# Patient Record
Sex: Male | Born: 1935 | Race: White | Hispanic: No | Marital: Married | Smoking: Never smoker
Health system: Southern US, Community
[De-identification: ages and names within clinical notes are randomized; demographics above are authoritative.]

## PROBLEM LIST (undated history)

## (undated) DIAGNOSIS — I10 Essential (primary) hypertension: Secondary | ICD-10-CM

## (undated) DIAGNOSIS — I2510 Atherosclerotic heart disease of native coronary artery without angina pectoris: Secondary | ICD-10-CM

## (undated) HISTORY — PX: CORONARY ANGIOPLASTY WITH STENT PLACEMENT: SHX49

## (undated) HISTORY — PX: HERNIA REPAIR: SHX51

---

## 2004-10-26 ENCOUNTER — Ambulatory Visit: Payer: Self-pay | Admitting: Internal Medicine

## 2004-10-28 ENCOUNTER — Ambulatory Visit: Payer: Self-pay | Admitting: Internal Medicine

## 2006-09-28 ENCOUNTER — Inpatient Hospital Stay: Payer: Self-pay | Admitting: General Surgery

## 2007-10-12 ENCOUNTER — Ambulatory Visit: Payer: Self-pay | Admitting: Internal Medicine

## 2009-12-15 ENCOUNTER — Ambulatory Visit: Payer: Self-pay | Admitting: Surgery

## 2009-12-25 ENCOUNTER — Ambulatory Visit: Payer: Self-pay | Admitting: Surgery

## 2011-01-23 ENCOUNTER — Emergency Department: Payer: Self-pay | Admitting: Emergency Medicine

## 2011-05-10 DIAGNOSIS — Z8711 Personal history of peptic ulcer disease: Secondary | ICD-10-CM

## 2011-05-10 DIAGNOSIS — I10 Essential (primary) hypertension: Secondary | ICD-10-CM | POA: Diagnosis present

## 2011-05-10 DIAGNOSIS — I2510 Atherosclerotic heart disease of native coronary artery without angina pectoris: Secondary | ICD-10-CM | POA: Diagnosis present

## 2012-05-18 ENCOUNTER — Ambulatory Visit: Payer: Self-pay | Admitting: Internal Medicine

## 2013-06-20 ENCOUNTER — Inpatient Hospital Stay: Payer: Self-pay | Admitting: Internal Medicine

## 2013-06-20 LAB — CBC
HCT: 21.3 % — ABNORMAL LOW (ref 40.0–52.0)
HGB: 7.4 g/dL — ABNORMAL LOW (ref 13.0–18.0)
MCH: 31.8 pg (ref 26.0–34.0)
MCHC: 34.5 g/dL (ref 32.0–36.0)
MCV: 92 fL (ref 80–100)
Platelet: 246 10*3/uL (ref 150–440)
RBC: 2.32 10*6/uL — ABNORMAL LOW (ref 4.40–5.90)
RDW: 13.6 % (ref 11.5–14.5)
WBC: 16.1 10*3/uL — ABNORMAL HIGH (ref 3.8–10.6)

## 2013-06-20 LAB — BASIC METABOLIC PANEL
Anion Gap: 13 (ref 7–16)
BUN: 93 mg/dL — ABNORMAL HIGH (ref 7–18)
Calcium, Total: 7.9 mg/dL — ABNORMAL LOW (ref 8.5–10.1)
Chloride: 107 mmol/L (ref 98–107)
Co2: 17 mmol/L — ABNORMAL LOW (ref 21–32)
Creatinine: 2.98 mg/dL — ABNORMAL HIGH (ref 0.60–1.30)
EGFR (African American): 22 — ABNORMAL LOW
EGFR (Non-African Amer.): 19 — ABNORMAL LOW
Glucose: 346 mg/dL — ABNORMAL HIGH (ref 65–99)
Osmolality: 316 (ref 275–301)
Potassium: 3.8 mmol/L (ref 3.5–5.1)
Sodium: 137 mmol/L (ref 136–145)

## 2013-06-20 LAB — PROTIME-INR
INR: 1.1
Prothrombin Time: 14.4 secs (ref 11.5–14.7)

## 2013-06-20 LAB — TROPONIN I
Troponin-I: <0.02 ng/mL
Troponin-I: 0.02 ng/mL

## 2013-06-20 LAB — APTT: Activated PTT: <23 secs — ABNORMAL LOW (ref 23.6–35.9)

## 2013-06-21 LAB — CBC WITH DIFFERENTIAL/PLATELET
Basophil #: 0.1 10*3/uL (ref 0.0–0.1)
Basophil %: 0.3 %
Eosinophil #: 0 10*3/uL (ref 0.0–0.7)
Eosinophil %: 0.1 %
HCT: 24.5 % — ABNORMAL LOW (ref 40.0–52.0)
HGB: 8.6 g/dL — ABNORMAL LOW (ref 13.0–18.0)
Lymphocyte #: 0.9 10*3/uL — ABNORMAL LOW (ref 1.0–3.6)
Lymphocyte %: 5.7 %
MCH: 31 pg (ref 26.0–34.0)
MCHC: 35.3 g/dL (ref 32.0–36.0)
MCV: 88 fL (ref 80–100)
Monocyte #: 0.9 x10 3/mm (ref 0.2–1.0)
Monocyte %: 6.2 %
Neutrophil #: 13.4 10*3/uL — ABNORMAL HIGH (ref 1.4–6.5)
Neutrophil %: 87.7 %
Platelet: 182 10*3/uL (ref 150–440)
RBC: 2.79 10*6/uL — ABNORMAL LOW (ref 4.40–5.90)
RDW: 15.2 % — ABNORMAL HIGH (ref 11.5–14.5)
WBC: 15.2 10*3/uL — ABNORMAL HIGH (ref 3.8–10.6)

## 2013-06-21 LAB — CK TOTAL AND CKMB (NOT AT ARMC)
CK, Total: 103 U/L (ref 35–232)
CK, Total: 81 U/L (ref 35–232)
CK, Total: 80 U/L (ref 35–232)
CK-MB: 2 ng/mL (ref 0.5–3.6)
CK-MB: 1.8 ng/mL (ref 0.5–3.6)
CK-MB: 1.8 ng/mL (ref 0.5–3.6)

## 2013-06-21 LAB — TROPONIN I: Troponin-I: 0.05 ng/mL

## 2013-06-22 LAB — BASIC METABOLIC PANEL
Anion Gap: 5 — ABNORMAL LOW (ref 7–16)
BUN: 46 mg/dL — ABNORMAL HIGH (ref 7–18)
Calcium, Total: 7.7 mg/dL — ABNORMAL LOW (ref 8.5–10.1)
Chloride: 117 mmol/L — ABNORMAL HIGH (ref 98–107)
Co2: 22 mmol/L (ref 21–32)
Creatinine: 2.34 mg/dL — ABNORMAL HIGH (ref 0.60–1.30)
EGFR (African American): 30 — ABNORMAL LOW
EGFR (Non-African Amer.): 26 — ABNORMAL LOW
Glucose: 121 mg/dL — ABNORMAL HIGH (ref 65–99)
Osmolality: 300 (ref 275–301)
Potassium: 3.8 mmol/L (ref 3.5–5.1)
Sodium: 144 mmol/L (ref 136–145)

## 2013-06-22 LAB — CBC WITH DIFFERENTIAL/PLATELET
Basophil #: 0.1 10*3/uL (ref 0.0–0.1)
Basophil %: 1 %
Eosinophil #: 0.3 10*3/uL (ref 0.0–0.7)
Eosinophil %: 3.1 %
HCT: 26 % — ABNORMAL LOW (ref 40.0–52.0)
HGB: 9.3 g/dL — ABNORMAL LOW (ref 13.0–18.0)
Lymphocyte #: 0.9 10*3/uL — ABNORMAL LOW (ref 1.0–3.6)
Lymphocyte %: 8.7 %
MCH: 31.4 pg (ref 26.0–34.0)
MCHC: 35.5 g/dL (ref 32.0–36.0)
MCV: 88 fL (ref 80–100)
Monocyte #: 0.8 x10 3/mm (ref 0.2–1.0)
Monocyte %: 7.6 %
Neutrophil #: 8.4 10*3/uL — ABNORMAL HIGH (ref 1.4–6.5)
Neutrophil %: 79.6 %
Platelet: 146 10*3/uL — ABNORMAL LOW (ref 150–440)
RBC: 2.95 10*6/uL — ABNORMAL LOW (ref 4.40–5.90)
RDW: 15.6 % — ABNORMAL HIGH (ref 11.5–14.5)
WBC: 10.6 10*3/uL (ref 3.8–10.6)

## 2013-06-23 LAB — CBC WITH DIFFERENTIAL/PLATELET
Basophil #: 0.1 10*3/uL (ref 0.0–0.1)
Basophil %: 0.8 %
Eosinophil #: 0.4 10*3/uL (ref 0.0–0.7)
Eosinophil %: 3.9 %
HCT: 26.4 % — ABNORMAL LOW (ref 40.0–52.0)
HGB: 9.5 g/dL — ABNORMAL LOW (ref 13.0–18.0)
Lymphocyte #: 0.9 10*3/uL — ABNORMAL LOW (ref 1.0–3.6)
Lymphocyte %: 10.2 %
MCH: 31.6 pg (ref 26.0–34.0)
MCHC: 36.2 g/dL — ABNORMAL HIGH (ref 32.0–36.0)
MCV: 87 fL (ref 80–100)
Monocyte #: 0.7 x10 3/mm (ref 0.2–1.0)
Monocyte %: 7.9 %
Neutrophil #: 7 10*3/uL — ABNORMAL HIGH (ref 1.4–6.5)
Neutrophil %: 77.2 %
Platelet: 160 10*3/uL (ref 150–440)
RBC: 3.02 10*6/uL — ABNORMAL LOW (ref 4.40–5.90)
RDW: 15.1 % — ABNORMAL HIGH (ref 11.5–14.5)
WBC: 9.1 10*3/uL (ref 3.8–10.6)

## 2013-06-23 LAB — BASIC METABOLIC PANEL
Anion Gap: 6 — ABNORMAL LOW (ref 7–16)
BUN: 32 mg/dL — ABNORMAL HIGH (ref 7–18)
Calcium, Total: 8.1 mg/dL — ABNORMAL LOW (ref 8.5–10.1)
Chloride: 115 mmol/L — ABNORMAL HIGH (ref 98–107)
Co2: 22 mmol/L (ref 21–32)
Creatinine: 2.13 mg/dL — ABNORMAL HIGH (ref 0.60–1.30)
EGFR (African American): 33 — ABNORMAL LOW
EGFR (Non-African Amer.): 29 — ABNORMAL LOW
Glucose: 118 mg/dL — ABNORMAL HIGH (ref 65–99)
Osmolality: 293 (ref 275–301)
Potassium: 3.7 mmol/L (ref 3.5–5.1)
Sodium: 143 mmol/L (ref 136–145)

## 2013-08-05 ENCOUNTER — Ambulatory Visit: Payer: Self-pay | Admitting: Gastroenterology

## 2013-08-07 LAB — PATHOLOGY REPORT

## 2013-11-19 ENCOUNTER — Ambulatory Visit: Payer: Self-pay | Admitting: Orthopedic Surgery

## 2014-11-14 NOTE — Consult Note (Signed)
Pt seen and examined. Full consult to follow. Admitted with CP last night. Still with some CP. Troponin levels are neg so far. Known CAD hx with 5 cardiac stents. Recent increase in dyspepsia, heartburn, etc. On prilosec daily at home. On ASA/plavix. Last plavix Tues. Abd nontender. Needs EGD but would like to make sure current chest pain is NOT cardiac in origin AND off plavix for more days. If patient requires further hospital stay, then can arrange EGD on Monday. If patient stable to go home shortly, can arrange EGD as outpt. Continue PPI bid. Dr. Vira Agar to check on patient tomorrow. Thanks.  Electronic Signatures: Verdie Shire (MD)  (Signed on 28-Nov-14 10:59)  Authored  Last Updated: 28-Nov-14 10:59 by Verdie Shire (MD)

## 2014-11-14 NOTE — H&P (Signed)
Eddie Mullen NAME:  Eddie Mullen, Eddie Mullen MR#:  [id number] DATE OF BIRTH:  [birth year]  DATE OF ADMISSION:  06/20/2013  PRIMARY CARE PHYSICIAN: Dr. Emily Filbert.   REFERRING PHYSICIAN: Marijo Conception, PA-C  CHIEF COMPLAINT: Chest pain.   HISTORY OF PRESENT ILLNESS: Eddie Mullen is a 81 year old, pleasant, white male with past medical history of hypertension, hyperlipidemia, coronary artery disease, status post stent placement about 10 years back, presented to the Emergency Department with complaints of chest pain on the left side of the chest. The pain started while sleeping, woke him up from sleep. The pain is around left shoulder area. The Eddie Mullen states that this sharp in nature, associated with some shortness of breath. The pain is radiating to the left arm. Concerning this, took nitroglycerin, three pills without much improvement. Concerning this, called EMS and was brought to the Emergency Department. The Eddie Mullen was seen by his primary physician yesterday concerning about generalized weakness with complaints of black of stools. Labs were drawn; however, we have not received the lab results. The Eddie Mullen had one episode of black stools yesterday and a second episode of today. The Eddie Mullen is noted to have a hemoglobin of 7.2. The Eddie Mullen states that the Eddie Mullen has been normal baseline hemoglobin. The Eddie Mullen is on Plavix. The Eddie Mullen is currently receiving 2 units of packed RBC, started on Protonix drip. As the Eddie Mullen was complaining of chest pain, the Eddie Mullen was started on nitroglycerin drip and received two doses of morphine with some improvement. Denies having any shortness of breath. The Eddie Mullen at baseline walks around the house. The Eddie Mullen is also noted to have elevated BUN and creatinine. We do not have the Eddie Mullen's baseline the creatinine. The Eddie Mullen has BUN 96, creatinine of 2.96. Denies having any abdominal pain.   PAST MEDICAL HISTORY: 1.  Hypertension.  2. Hyperlipidemia.  3.  Coronary  artery disease status post stent placement.   PAST SURGICAL HISTORY: 1.  Abdominal hernia repair.  2.  Appendectomy.  3.  Cardiac stent placement.   ALLERGIES: PENICILLIN.   HOME MEDICATIONS: 1.  Ambien 10 mg at bedtime.  2.  Plavix 75 mg once a day.  3.  Toprol-XL 1.5 mg once a day.  4.  Imdur 30 mg once a day.  5.  Atorvastatin 40 mg once a day.  6.  Amlodipine 10 mg once a day.   SOCIAL HISTORY: No history of smoking, drinking alcohol or using illicit drugs. Married and lives with his wife.   FAMILY HISTORY: History of  coronary artery disease.   REVIEW OF SYSTEMS: CONSTITUTIONAL: Denies any generalized weakness.  EYES: No change in vision.  ENT: No change in hearing.  RESPIRATORY:  No cough, shortness of breath.  CARDIOVASCULAR: Has chest pain, palpitations.  GASTROINTESTINAL: Has  no nausea, vomiting, diarrhea, abdominal pain. Has black stools.  GENITOURINARY: No dysuria or hematuria.  ENDOCRINE: No polyuria or polydipsia.  HEMATOLOGIC:  No easy bruising or bleeding.  SKIN: No rash or lesions.  MUSCULOSKELETAL: No joint pains and aches.  NEUROLOGIC:  No weakness or numbness in any part of the body.   PHYSICAL EXAMINATION: GENERAL: This is a well-built, well-nourished, age-appropriate male, lying down in the bed, not in distress.  VITAL SIGNS: Temperature 97.6, pulse 102, blood pressure 185/80, respiratory rate of 16, oxygen saturation 100% on 2 liters of oxygen.  HEENT: Head normocephalic, atraumatic. Eyes: No scleral icterus. Conjunctivae normal. Pupils equal and react to light. Mucous membranes moist. No pharyngeal erythema.   NECK: Supple. No  lymphadenopathy. No JVD. No carotid bruit.  CHEST: Has no focal tenderness.  LUNGS: Bilaterally clear to auscultation.  HEART: S1, S2 regular. No murmurs are heard.  ABDOMEN: Bowel sounds present. Soft, nontender, nondistended.  EXTREMITIES: No pedal edema. Pulses 2+.  NEUROLOGIC:  The Eddie Mullen is alert, oriented to place,  person and time. Cranial nerves II through XII intact. Motor 5/5 in upper and lower extremities.  SKIN: No rashes or lesions.  MUSCULOSKELETAL: Good range of motion in all the extremities.   LABORATORY, DIAGNOSTIC AND RADIOLOGIC DATA: CBC: WBC of 15.1, hemoglobin 7.4, platelet count of 246.   Complete metabolic panel: Glucose 123456, BUN 93, creatinine of 2.98, bicarbonate 17.   Recent ultrasound of the kidneys showed increased echotexture of the cortex  of the liver suggestive of medical renal disease.    Guaiac profile is within normal limits.   Chest x-ray, one view portable: No acute cardiopulmonary disease.   EKG, 12-lead, shows normal sinus rhythm with no ST-T wave abnormalities.    ASSESSMENT AND PLAN: Eddie Mullen is a 82 year old male with known history of coronary artery disease comes to the Emergency Department with complaints of chest pain and gastrointestinal bleed. He is also found to have acute renal failure.  1.  Chest pain.  This seems to be more of a musculoskeletal pain. However, considering the Eddie Mullen's history of coronary artery disease, we will further cycle cardiac enzymes x 3. We will to discontinue the nitroglycerin drip as this seems to be more from the musculoskeletal pain.  2.  Gastrointestinal bleed. The Eddie Mullen is on Plavix. We will hold the Plavix. Keep the Eddie Mullen on Protonix drip. The Eddie Mullen had last EGD about 10 years back. We will consult gastroenterology in the morning. Continue to monitor CBC. The Eddie Mullen is currently receiving 2 units of packed red blood cells.   3.  Acute renal failure. Uncertain about the Eddie Mullen's baseline. We do not have any labs to compare.  However, the Eddie Mullen has elevated BUN 96 and creatinine of 2.96. Continue with IV fluids and follow up. The Eddie Mullen had a recent echocardiogram done, which showed medical renal disease. We will consult nephrology.  4.  Anemia secondary to acute blood loss. The Eddie Mullen is currently receiving 2 units of  packed red blood cells.  5.  Hypertension, poorly controlled. This could be secondary to anxiety. We will continue to follow up. The Eddie Mullen initially had normal blood pressure.  6.  History of coronary artery disease. We will hold the Plavix, continue with the metoprolol.  7.  Keep the Eddie Mullen on deep vein thrombosis prophylaxis with sequential compression devices.   TIME SPENT: 45 minutes.   ____________________________ Monica Becton, MD pv:cc D: 06/21/2013 00:18:00 ET T: 06/21/2013 00:41:13 ET JOB#: [id number]  cc: Monica Becton, MD, <Dictator> Rusty Aus, MD Grier Mitts Che Rachal MD ELECTRONICALLY SIGNED 06/23/2013 0:47

## 2014-11-14 NOTE — Discharge Summary (Signed)
PATIENT NAME:  Eddie Mullen, Eddie Mullen MR#:  [id number] DATE OF BIRTH:  [birth year]  DATE OF ADMISSION:  06/20/2013 DATE OF DISCHARGE:  06/23/2013  DISCHARGE DIAGNOSES: 1.  Upper gastrointestinal bleed with blood loss anemia.  2.  Coronary artery disease with ongoing angina, relieved with transfusion.   DISCHARGE MEDICATIONS:  Please see [facility] medical reconciliation form for details. Changes were he will hold the Plavix for 2 days and he will increase his omeprazole at home from 20 mg daily to 40 mg, 2 of the 20 mg tablets he has.    HISTORY AND PHYSICAL: Please see detailed history and physical done on admission for details.   HOSPITAL COURSE: The patient was admitted with ongoing chest pain, melanotic stools and a hemoglobin of 7.4. His creatinine was also increased to 2.98, BUN to 93. His melena improved on IV pantoprazole. His angina also improved after a transfusion up to a hemoglobin of 9.5. Creatinine decreased to 2.13, BUN to 32. The exaggerated increase in BUN was likely due to upper GI bleed. He was ambulating, tolerating diet well by today. He had been seen by 2 different gastroenterologists, both of which did not wish to do EGD at this time. He probably does need an upper gastrointestinal luminal evaluation or at least an upper GI x-ray series to ensure there are no ominous findings to explain his bleeding. For now, he is happy, excited about the discharge, ambulating and ready to go home. I did evaluate his old records from his [place] cardiologist, who actually discontinued his Plavix in March but for some reason he had continued on that. Therefore, I think 2 days off of it should be okay.  His stents are not likely drug-eluding given that. He is also more than 2-1/2 years past history his stents.    TIME SPENT: Please note, I spent approximately 35 minutes on discharge tasks today.   ____________________________ Ocie Cornfield. Ouida Sills, MD mwa:cs D: 06/23/2013 09:14:26 ET T: 06/23/2013 19:24:52  ET JOB#: [id number]  cc: Ocie Cornfield. Ouida Sills, MD, <Dictator> Kirk Ruths MD ELECTRONICALLY SIGNED 06/24/2013 8:01

## 2014-11-14 NOTE — Consult Note (Signed)
Pt with hx of CAD and several stents, last one placed 2 years ago.  Had melena a few days ago and was admitted.  Hgb stable at 9.3.  Was taking ibuprofen.  Plt ct 146.  No complaints today.  Will advance diet to full liquids for supper today.  VSS afeb.  Can go home on 81mg  ASA tomorrow if all goes well til then.  Very likely NSAID induced bleeding, should be on full liquid diet for 3 more days then soft diet.  Follow up in office next week with Dr. Candace Cruise as I will be out of town. Go home on generic Protonix which will not interfere  with plavix as much as omeprazole.  Electronic Signatures: Manya Silvas (MD)  (Signed on 29-Nov-14 11:16)  Authored  Last Updated: 29-Nov-14 11:16 by Manya Silvas (MD)

## 2014-11-14 NOTE — Consult Note (Signed)
PATIENT NAME:  Eddie Mullen, Eddie Mullen MR#:  [id number] DATE OF BIRTH:  10-19-1932  DATE OF CONSULTATION:  06/21/2013  REFERRING PHYSICIAN:   CONSULTING PHYSICIAN:  Lupita Dawn. Ameera Tigue, MD  REASON FOR REFERRAL: Melena, chest pain.   DISCUSSION: The patient is a 81 year old white male with a known history of coronary artery disease who has had at least 5 cardiac stents placed over the years. He came to the Emergency Room last night complaining of chest pain on the left side that radiated to his left arm. It actually woke him up from sleep. He has some shortness of breath. He took some nitroglycerins on his own without much relief. He was then brought to the Emergency Room for further evaluation.   He had been complaining of generalized weakness with some dyspepsia as well as some heartburn. He also noticed some dark stools as well recently. He has been on Prilosec daily on and off for the past year. According to the wife, he has not been that compliant with his Prilosec.   The patient has been on aspirin and Plavix.   When he presented, his hemoglobin was 7.2. The Plavix was stopped. The patient required 2 units of blood overnight. He was also placed on a Protonix drip.   He is feeling much better overall, but the chest pain is still present. It is on and off. He denies any shortness of breath.   REVIEW OF SYSTEMS: There are no fevers or chills. There are no palpitations. There is chest pain. He had some mild shortness of breath yesterday but not today. There is no coughing. There are no visual or hearing changes. GI symptoms have been described already. There is no gross hematochezia or melena. The rest of the review of systems are negative.   PAST MEDICAL HISTORY: Notable for hypertension, hyperlipidemia, and coronary artery disease.   PAST SURGICAL HISTORY: Includes hernia repair, appendectomy as well as stent placement.   ALLERGIES: PENICILLIN.   HOME MEDICATIONS: Include Ambien at bedtime, amlodipine  10 mg daily, Imdur 30 mg daily, metoprolol XL, Plavix 75 mg daily, and atorvastatin 40 mg daily.   SOCIAL HISTORY: The patient denies smoking and drinking.   FAMILY HISTORY: Notable for CAD.  PHYSICAL EXAMINATION: GENERAL: The patient is in no acute distress.  VITAL SIGNS: He is afebrile. Vital signs are stable except initial blood pressure was high.  HEAD AND NECK: Within normal limits.  CARDIAC: Regular rhythm and rate.  LUNGS: Clear bilaterally.  ABDOMEN: Normoactive bowel sounds, soft, nontender right now. There is no hepatomegaly. He has active bowel sounds.  EXTREMITIES: No clubbing, cyanosis, or edema.  NEUROLOGIC: Nonfocal.  SKIN: Negative. RECTAL: In the Emergency Room, heme stool was heme negative though.   LABORATORY DATA: White count 15.1, hemoglobin 7.4, platelet count 214. Today with blood transfusion, hemoglobin is up to 8.6 and white count is still elevated at 15.2. CPK enzymes are normal. Creatinine is 2.98. INR is 1.1.   ASSESSMENT AND PLAN: This a patient with known history of coronary artery disease who has some chest pain. Even though the troponin levels are negative, we still need to make sure there is no pain coming from his heart. The patient probably has peptic ulcer disease. He is placed on a Protonix drip. I would like to schedule the endoscopy once he has been off the Plavix for a few days. The last dose of Plavix was on Tuesday. We need to make sure cardiology rules him out for a heart condition.  Will tentatively plan on doing an endoscopy on Monday unless condition changes. Thank you for the referral.  ____________________________ Lupita Dawn. Candace Cruise, MD pyo:sb D: 06/21/2013 15:16:00 ET T: 06/21/2013 15:32:00 ET JOB#: [id number]  cc: Lupita Dawn. Candace Cruise, MD, <Dictator> Lupita Dawn Libia Fazzini MD ELECTRONICALLY SIGNED 06/22/2013 11:36

## 2014-11-15 NOTE — Op Note (Signed)
PATIENT NAME:  Eddie Mullen, Eddie Mullen MR#:  [id number] DATE OF BIRTH:  1932-08-22  DATE OF PROCEDURE:  11/19/2013  PREOPERATIVE DIAGNOSIS:  Right anterior shoulder dislocation.   POSTOPERATIVE DIAGNOSIS:  Right anterior shoulder dislocation.  PROCEDURE PERFORMED:  Right shoulder closed reduction.   ANESTHESIA:  General.   SURGEON:  Hessie Knows, M.D.   DESCRIPTION OF PROCEDURE:  The patient was brought to the operating room and after adequate anesthesia was obtained, appropriate patient identification and timeout procedures were completed.  The C-arm was brought in and traction applied to the arm. First, a Saha maneuver was attempted. Under fluoroscopy, the entire scapula moved with the humeral head, and it appeared that the humeral head was locked on the glenoid. Gentle rotation was applied, and again, the scapula appeared to be attached to the glenoid. With some gentle extension of the shoulder with traction, this appeared to disengage what was presumed to be a Hill-Sachs lesion, and the shoulder could be gently rotated at this point; was brought into the Fontanelle position at this point.  With flexion, slight abduction and traction and with some gentle internal rotation with  anterior pressure on the humeral head, which could be palpated, the shoulder could be felt to reduce and visibly reduced on the C-arm. An AP C-arm view was saved showing the reduced shoulder. The patient was placed in a sling and sent to the recovery room in stable condition. There were no complications. No blood loss. No specimen.     ____________________________ Laurene Footman, MD mjm:dmm D: 11/19/2013 21:17:18 ET T: 11/19/2013 21:46:03 ET JOB#: [id number]  cc: Laurene Footman, MD, <Dictator> Laurene Footman MD ELECTRONICALLY SIGNED 11/20/2013 6:32

## 2014-12-11 DIAGNOSIS — N184 Chronic kidney disease, stage 4 (severe): Secondary | ICD-10-CM | POA: Diagnosis present

## 2015-12-25 DIAGNOSIS — R809 Proteinuria, unspecified: Secondary | ICD-10-CM | POA: Insufficient documentation

## 2015-12-25 DIAGNOSIS — E782 Mixed hyperlipidemia: Secondary | ICD-10-CM | POA: Insufficient documentation

## 2016-07-07 ENCOUNTER — Other Ambulatory Visit: Payer: Self-pay | Admitting: Internal Medicine

## 2016-07-07 DIAGNOSIS — R55 Syncope and collapse: Secondary | ICD-10-CM

## 2016-07-07 DIAGNOSIS — Z0000 Encounter for general adult medical examination without abnormal findings: Secondary | ICD-10-CM | POA: Insufficient documentation

## 2016-07-12 ENCOUNTER — Ambulatory Visit
Admission: RE | Admit: 2016-07-12 | Discharge: 2016-07-12 | Disposition: A | Discharge status: Home / Self Care | Payer: Medicare Other | Source: Ambulatory Visit | Attending: Internal Medicine | Admitting: Internal Medicine

## 2016-07-12 DIAGNOSIS — R55 Syncope and collapse: Secondary | ICD-10-CM

## 2016-07-12 DIAGNOSIS — I6523 Occlusion and stenosis of bilateral carotid arteries: Secondary | ICD-10-CM | POA: Insufficient documentation

## 2018-03-23 ENCOUNTER — Emergency Department: Payer: Medicare Other

## 2018-03-23 ENCOUNTER — Encounter: Payer: Self-pay | Admitting: Emergency Medicine

## 2018-03-23 ENCOUNTER — Other Ambulatory Visit: Payer: Self-pay

## 2018-03-23 ENCOUNTER — Observation Stay
Admission: EM | Admit: 2018-03-23 | Discharge: 2018-03-24 | Disposition: A | Discharge status: Home / Self Care | Payer: Medicare Other | Attending: Internal Medicine | Admitting: Internal Medicine

## 2018-03-23 DIAGNOSIS — Z79899 Other long term (current) drug therapy: Secondary | ICD-10-CM | POA: Diagnosis not present

## 2018-03-23 DIAGNOSIS — I700 Atherosclerosis of aorta: Secondary | ICD-10-CM | POA: Diagnosis not present

## 2018-03-23 DIAGNOSIS — I129 Hypertensive chronic kidney disease with stage 1 through stage 4 chronic kidney disease, or unspecified chronic kidney disease: Secondary | ICD-10-CM | POA: Insufficient documentation

## 2018-03-23 DIAGNOSIS — Z955 Presence of coronary angioplasty implant and graft: Secondary | ICD-10-CM | POA: Insufficient documentation

## 2018-03-23 DIAGNOSIS — Z7982 Long term (current) use of aspirin: Secondary | ICD-10-CM | POA: Diagnosis not present

## 2018-03-23 DIAGNOSIS — R079 Chest pain, unspecified: Secondary | ICD-10-CM | POA: Diagnosis present

## 2018-03-23 DIAGNOSIS — R0789 Other chest pain: Secondary | ICD-10-CM | POA: Diagnosis not present

## 2018-03-23 DIAGNOSIS — E785 Hyperlipidemia, unspecified: Secondary | ICD-10-CM | POA: Diagnosis not present

## 2018-03-23 DIAGNOSIS — I2510 Atherosclerotic heart disease of native coronary artery without angina pectoris: Secondary | ICD-10-CM | POA: Diagnosis not present

## 2018-03-23 DIAGNOSIS — Z8249 Family history of ischemic heart disease and other diseases of the circulatory system: Secondary | ICD-10-CM | POA: Insufficient documentation

## 2018-03-23 DIAGNOSIS — Z880 Allergy status to penicillin: Secondary | ICD-10-CM | POA: Diagnosis not present

## 2018-03-23 DIAGNOSIS — Z66 Do not resuscitate: Secondary | ICD-10-CM | POA: Diagnosis not present

## 2018-03-23 DIAGNOSIS — N184 Chronic kidney disease, stage 4 (severe): Secondary | ICD-10-CM | POA: Diagnosis not present

## 2018-03-23 HISTORY — DX: Essential (primary) hypertension: I10

## 2018-03-23 HISTORY — DX: Atherosclerotic heart disease of native coronary artery without angina pectoris: I25.10

## 2018-03-23 LAB — BASIC METABOLIC PANEL
Anion gap: 9 (ref 5–15)
BUN: 49 mg/dL — ABNORMAL HIGH (ref 8–23)
CO2: 22 mmol/L (ref 22–32)
Calcium: 8.6 mg/dL — ABNORMAL LOW (ref 8.9–10.3)
Chloride: 106 mmol/L (ref 98–111)
Creatinine, Ser: 3.5 mg/dL — ABNORMAL HIGH (ref 0.61–1.24)
GFR calc Af Amer: 17 mL/min — ABNORMAL LOW (ref >60)
GFR calc non Af Amer: 15 mL/min — ABNORMAL LOW (ref >60)
Glucose, Bld: 215 mg/dL — ABNORMAL HIGH (ref 70–99)
Potassium: 4.1 mmol/L (ref 3.5–5.1)
Sodium: 137 mmol/L (ref 135–145)

## 2018-03-23 LAB — GLUCOSE, CAPILLARY: Glucose-Capillary: 111 mg/dL — ABNORMAL HIGH (ref 70–99)

## 2018-03-23 LAB — CBC
HCT: 31.8 % — ABNORMAL LOW (ref 40.0–52.0)
Hemoglobin: 11.1 g/dL — ABNORMAL LOW (ref 13.0–18.0)
MCH: 32.8 pg (ref 26.0–34.0)
MCHC: 34.9 g/dL (ref 32.0–36.0)
MCV: 93.9 fL (ref 80.0–100.0)
Platelets: 203 10*3/uL (ref 150–440)
RBC: 3.39 MIL/uL — ABNORMAL LOW (ref 4.40–5.90)
RDW: 13.6 % (ref 11.5–14.5)
WBC: 9.4 10*3/uL (ref 3.8–10.6)

## 2018-03-23 LAB — TROPONIN I
Troponin I: <0.03 ng/mL (ref <0.03)
Troponin I: 0.04 ng/mL (ref <0.03)
Troponin I: 0.09 ng/mL (ref <0.03)

## 2018-03-23 MED ORDER — ASPIRIN EC 81 MG PO TBEC
81.0000 mg | DELAYED_RELEASE_TABLET | Freq: Every day | ORAL | Status: DC
  Administered 2018-03-24: 81 mg via ORAL
  Filled 2018-03-23: qty 1

## 2018-03-23 MED ORDER — METOPROLOL SUCCINATE ER 50 MG PO TB24
75.0000 mg | ORAL_TABLET | Freq: Every day | ORAL | Status: DC
  Administered 2018-03-24: 75 mg via ORAL
  Filled 2018-03-23: qty 1

## 2018-03-23 MED ORDER — INSULIN ASPART 100 UNIT/ML ~~LOC~~ SOLN: 0.0000 [IU] | Freq: Three times a day (TID) | SUBCUTANEOUS | Status: DC

## 2018-03-23 MED ORDER — ISOSORBIDE MONONITRATE ER 30 MG PO TB24
30.0000 mg | ORAL_TABLET | Freq: Every day | ORAL | Status: DC
  Administered 2018-03-24: 30 mg via ORAL
  Filled 2018-03-23: qty 1

## 2018-03-23 MED ORDER — ASPIRIN 81 MG PO CHEW
324.0000 mg | CHEWABLE_TABLET | Freq: Once | ORAL | Status: AC
  Administered 2018-03-23: 324 mg via ORAL
  Filled 2018-03-23: qty 4

## 2018-03-23 MED ORDER — ONDANSETRON HCL 4 MG PO TABS: 4.0000 mg | ORAL_TABLET | Freq: Four times a day (QID) | ORAL | Status: DC | PRN

## 2018-03-23 MED ORDER — ZOLPIDEM TARTRATE 5 MG PO TABS
10.0000 mg | ORAL_TABLET | Freq: Every evening | ORAL | Status: DC | PRN
  Filled 2018-03-23: qty 2

## 2018-03-23 MED ORDER — ATORVASTATIN CALCIUM 20 MG PO TABS: 40.0000 mg | ORAL_TABLET | Freq: Every evening | ORAL | Status: DC

## 2018-03-23 MED ORDER — ACETAMINOPHEN 325 MG PO TABS: 650.0000 mg | ORAL_TABLET | Freq: Four times a day (QID) | ORAL | Status: DC | PRN

## 2018-03-23 MED ORDER — ACETAMINOPHEN 650 MG RE SUPP: 650.0000 mg | Freq: Four times a day (QID) | RECTAL | Status: DC | PRN

## 2018-03-23 MED ORDER — AMLODIPINE BESYLATE 5 MG PO TABS
5.0000 mg | ORAL_TABLET | Freq: Every day | ORAL | Status: DC
  Administered 2018-03-24: 5 mg via ORAL
  Filled 2018-03-23: qty 1

## 2018-03-23 MED ORDER — HEPARIN SODIUM (PORCINE) 5000 UNIT/ML IJ SOLN
5000.0000 [IU] | Freq: Three times a day (TID) | INTRAMUSCULAR | Status: DC
  Administered 2018-03-23 – 2018-03-24 (×2): 5000 [IU] via SUBCUTANEOUS
  Filled 2018-03-23 (×2): qty 1

## 2018-03-23 MED ORDER — INSULIN ASPART 100 UNIT/ML ~~LOC~~ SOLN: 0.0000 [IU] | Freq: Every day | SUBCUTANEOUS | Status: DC

## 2018-03-23 MED ORDER — AMLODIPINE BESYLATE 5 MG PO TABS
5.0000 mg | ORAL_TABLET | ORAL | Status: AC
  Administered 2018-03-23: 5 mg via ORAL
  Filled 2018-03-23: qty 1

## 2018-03-23 MED ORDER — ASPIRIN EC 81 MG PO TBEC: 81.0000 mg | DELAYED_RELEASE_TABLET | Freq: Every day | ORAL | 2 refills | Status: AC

## 2018-03-23 MED ORDER — ASPIRIN 81 MG PO CHEW
CHEWABLE_TABLET | ORAL | Status: AC
  Filled 2018-03-23: qty 4

## 2018-03-23 MED ORDER — ZOLPIDEM TARTRATE 5 MG PO TABS
5.0000 mg | ORAL_TABLET | Freq: Every evening | ORAL | Status: DC | PRN
  Administered 2018-03-23: 5 mg via ORAL
  Filled 2018-03-23: qty 1

## 2018-03-23 MED ORDER — ONDANSETRON HCL 4 MG/2ML IJ SOLN: 4.0000 mg | Freq: Four times a day (QID) | INTRAMUSCULAR | Status: DC | PRN

## 2018-03-23 NOTE — ED Triage Notes (Signed)
Pt reports he woke up from nap not feeling right and BP was 200s/100s.  Had a pain in his left arm and some diaphoresis. Pain and diaphoresis resolved.  Did have some SHOB at first but that has also resolved.

## 2018-03-23 NOTE — ED Provider Notes (Signed)
[facility] Emergency Department Provider Note  Time seen: 5:52 PM  I have reviewed the triage vital signs and the nursing notes.   HISTORY  Chief Complaint Chest Pain    HPI CADIN LUKA is a 84 y.o. male with a past medical history of CAD, hypertension, prior stents, 3 placed in 2012 2 placed in 2001, currently follows up at [facility] cardiology presents to the emergency department with chest pain.  According to the patient around 4:30 PM he woke from a nap with significant chest pressure/tightness across both sides of his chest.  States he was feeling somewhat short of breath and nauseous his palms were sweaty.  He checked his blood pressure it stated 200s over 100s, patient became very concerned so he came to the emergency department.  Patient states the chest pain has since completely resolved denies any chest pain or trouble breathing at this time.  States he feels normal.  Patient states he rarely ever gets chest pain and has never had chest pain this bad.  Describes as moderate to severe tightness/squeezing but now resolved completely.   Past Medical History:  Diagnosis Date  . CAD (coronary artery disease)   . Hypertension     There are no active problems to display for this patient.   Past Surgical History:  Procedure Laterality Date  . CORONARY ANGIOPLASTY WITH STENT PLACEMENT    . HERNIA REPAIR      Prior to Admission medications   Not on File    Allergies  Allergen Reactions  . Penicillins Rash    History reviewed. No pertinent family history.  Social History Social History   Tobacco Use  . Smoking status: Never Smoker  . Smokeless tobacco: Never Used  Substance Use Topics  . Alcohol use: Yes  . Drug use: Never    Review of Systems Constitutional: Negative for fever Cardiovascular: Chest pain now resolved Respiratory: Shortness of breath now resolved Gastrointestinal: Negative for abdominal pain, vomiting Genitourinary:  Negative for urinary compaints Musculoskeletal: Negative for leg pain or swelling Skin: Negative for skin complaints  Neurological: Negative for headache All other ROS negative  ____________________________________________   PHYSICAL EXAM:  VITAL SIGNS: ED Triage Vitals  Enc Vitals Group     BP 03/23/18 1729 (!) 193/92     Pulse Rate 03/23/18 1729 89     Resp 03/23/18 1729 18     Temp 03/23/18 1729 98.2 F (36.8 C)     Temp Source 03/23/18 1729 Oral     SpO2 03/23/18 1729 100 %     Weight 03/23/18 1728 144 lb (65.3 kg)     Height 03/23/18 1728 5\' 7"  (1.702 m)     Head Circumference --      Peak Flow --      Pain Score 03/23/18 1727 0     Pain Loc --      Pain Edu? --      Excl. in [place]? --    Constitutional: Alert and oriented. Well appearing and in no distress. Eyes: Normal exam ENT   Head: Normocephalic and atraumatic.   Mouth/Throat: Mucous membranes are moist. Cardiovascular: Normal rate, regular rhythm. No murmur Respiratory: Normal respiratory effort without tachypnea nor retractions. Breath sounds are clear Gastrointestinal: Soft and nontender. No distention.   Musculoskeletal: Nontender with normal range of motion in all extremities. No lower extremity tenderness or edema. Neurologic:  Normal speech and language. No gross focal neurologic deficits Skin:  Skin is warm, dry and  intact.  Psychiatric: Mood and affect are normal.  ____________________________________________    EKG  EKG reviewed and interpreted by myself shows normal sinus rhythm 88 bpm with a narrow QRS, normal axis, largely normal intervals patient does have slight ST depression in the lateral leads.  ____________________________________________    RADIOLOGY  Chest x-ray clear  ____________________________________________   INITIAL IMPRESSION / ASSESSMENT AND PLAN / ED COURSE  Pertinent labs & imaging results that were available during my care of the patient were reviewed by me  and considered in my medical decision making (see chart for details).  Patient presents emergency department for acute onset of chest pain approximately 1.5 hours ago.  Chest pain and shortness of breath has since resolved.  Differential would include ACS, chest wall pain, hypertension, gastric pain, angina.  We will check labs and continue to closely monitor.  Reassuringly patient denies any pain discomfort or shortness of breath at this time.  EKG is somewhat concerning given the lateral ST depressions.  Blood pressure remains elevated 181/87 although not dangerously so.  I reviewed the patient's old EKGs, lateral ST depressions unchanged from 2014.  Chest x-ray is clear.  Patient's labs are largely at baseline.  Creatinine of 3.5 however in reviewing the patient's care everywhere labs at [facility] this has been baseline as of recently.  Troponin is negative.  I discussed with the patient given his history of stents and concerning symptoms he was expensing earlier tonight offered admission to the hospital.  Patient states he was strongly wish to go home as he is chest pain-free and feels normal.  He states he will follow-up with his cardiologist on Tuesday.  He is agreeable to stay for a repeat troponin at 830.  If his repeat troponin is negative and the patient remains chest pain-free he would like to go home, if his troponin comes back positive he is agreeable to stay.  His repeat troponin is elevated at 0.04.  Although only slightly elevated his initial troponin was negative and he has a strong cardiac history, and good story for ACS.  We will dose aspirin and admit to the hospitalist for further work-up. ____________________________________________   FINAL CLINICAL IMPRESSION(S) / ED DIAGNOSES  Chest pain    Harvest Dark, MD 03/23/18 2006

## 2018-03-23 NOTE — Progress Notes (Addendum)
Patient ID: Eddie Mullen, male   DOB: 02/21/1933, 84 y.o.   MRN: [id number]  ACP note  Patient and wife at the bedside  Diagnosis: Chest pain with history of coronary artery disease, accelerated hypertension, chronic kidney disease stage IV, hyperlipidemia, impaired fasting glucose.  CODE STATUS discussed.  Patient wishes to be a DNR.  Plan.  Observe overnight on telemetry.  Norvasc for blood pressure.  Stress test tomorrow morning.  Time spent on ACP discussion 17 minutes Dr. Loletha Grayer

## 2018-03-23 NOTE — ED Notes (Signed)
ED Provider at bedside. 

## 2018-03-23 NOTE — ED Notes (Signed)
Patient assisted to bathroom 

## 2018-03-23 NOTE — ED Notes (Signed)
Admitting at bedside 

## 2018-03-23 NOTE — ED Notes (Signed)
Date and time results received: 03/23/18 1959 (use smartphrase ".now" to insert current time)  Test: troponin  Critical Value: 0.04  Name of Provider Notified: Dr. Kerman Passey  Orders Received? Or Actions Taken?: acknowledged

## 2018-03-23 NOTE — H&P (Signed)
[facility] NAME: Eddie Mullen    MR#:  [id number]  DATE OF BIRTH:  [birth year]  DATE OF ADMISSION:  03/23/2018  PRIMARY CARE PHYSICIAN: Rusty Aus, MD   REQUESTING/REFERRING PHYSICIAN: Dr Kerman Passey  CHIEF COMPLAINT:   Chief Complaint  Patient presents with  . Chest Pain    HISTORY OF PRESENT ILLNESS:  Eddie Mullen  is a 84 y.o. male with a known history of CAD presented with left upper chest pain and shoulder, 4-5 out of 10 in intensity.  Pain felt very sharp only lasting a few minutes and then went away on its own. This occurred after he woke up from a nap.  Patient did have a little bit of sweating and some shortness of breath.  When he walks he does not get any chest pain.  In the ER his first troponin was 0.03 and second troponin 0.04.  Hospitalist services were contacted for further evaluation.  PAST MEDICAL HISTORY:   Past Medical History:  Diagnosis Date  . CAD (coronary artery disease)   . Hypertension     PAST SURGICAL HISTORY:   Past Surgical History:  Procedure Laterality Date  . CORONARY ANGIOPLASTY WITH STENT PLACEMENT    . HERNIA REPAIR      SOCIAL HISTORY:   Social History   Tobacco Use  . Smoking status: Never Smoker  . Smokeless tobacco: Never Used  Substance Use Topics  . Alcohol use: Yes    FAMILY HISTORY:   Family History  Problem Relation Age of Onset  . CVA Mother   . CAD Mother     DRUG ALLERGIES:   Allergies  Allergen Reactions  . Penicillins Rash    REVIEW OF SYSTEMS:  CONSTITUTIONAL: No fever, positive for sweating.  Positive for some weight loss.  No fatigue or weakness.  EYES: No blurred or double vision.  EARS, NOSE, AND THROAT: No tinnitus or ear pain. No sore throat.  Positive for raspy throat. RESPIRATORY: No cough, shortness of breath, wheezing or hemoptysis.  CARDIOVASCULAR: Left upper shoulder and chest pain, no orthopnea, edema.   GASTROINTESTINAL: No nausea, vomiting, diarrhea or abdominal pain. No blood in bowel movements GENITOURINARY: No dysuria, hematuria.  ENDOCRINE: No polyuria, nocturia,  HEMATOLOGY: No anemia, easy bruising or bleeding SKIN: No rash or lesion.  Some itching the other week. MUSCULOSKELETAL: No joint pain or arthritis.   NEUROLOGIC: No tingling, numbness, weakness.  PSYCHIATRY: No anxiety or depression.   MEDICATIONS AT HOME:   Prior to Admission medications   Medication Sig Start Date End Date Taking? Authorizing Provider  amLODipine (NORVASC) 2.5 MG tablet Take 2.5-5 mg by mouth daily. 5MG -AM,  2.5MG -PM 03/21/18  Yes [provider]  atorvastatin (LIPITOR) 40 MG tablet Take 1 tablet by mouth daily. 01/03/18  Yes [provider]  isosorbide mononitrate (IMDUR) 30 MG 24 hr tablet Take 1 tablet by mouth daily. 03/21/18  Yes [provider]  metoprolol succinate (TOPROL-XL) 50 MG 24 hr tablet Take 75 mg by mouth daily. 03/07/18  Yes [provider]  zolpidem (AMBIEN) 10 MG tablet Take 10 mg by mouth at bedtime as needed for sleep.   Yes [provider]  aspirin EC 81 MG tablet Take 1 tablet (81 mg total) by mouth daily. 03/23/18 03/23/19  Loletha Grayer, MD      VITAL SIGNS:  Blood pressure (!) 198/93, pulse 79, temperature 98.2 F (36.8 C), temperature source Oral, resp. rate (!) 22,  height 5\' 7"  (1.702 m), weight 65.3 kg, SpO2 100 %. Previous blood pressures in the 160s.  Patient states he is due for his Norvasc. PHYSICAL EXAMINATION:  GENERAL:  84 y.o.-year-old patient lying in the bed with no acute distress.  EYES: Pupils equal, round, reactive to light and accommodation. No scleral icterus. Extraocular muscles intact.  HEENT: Head atraumatic, normocephalic. Oropharynx and nasopharynx clear.  NECK:  Supple, no jugular venous distention. No thyroid enlargement, no tenderness.  LUNGS: Normal breath sounds bilaterally, no wheezing, rales,rhonchi  or crepitation. No use of accessory muscles of respiration.  CARDIOVASCULAR: S1, S2 normal. No murmurs, rubs, or gallops.  ABDOMEN: Soft, nontender, nondistended. Bowel sounds present. No organomegaly or mass.  EXTREMITIES: No pedal edema, cyanosis, or clubbing.  NEUROLOGIC: Cranial nerves II through XII are intact. Muscle strength 5/5 in all extremities. Sensation intact. Gait not checked.  PSYCHIATRIC: The patient is alert and oriented x 3.  SKIN: No rash, lesion, or ulcer.   LABORATORY PANEL:   CBC Recent Labs  Lab 03/23/18 1737  WBC 9.4  HGB 11.1*  HCT 31.8*  PLT 203   ------------------------------------------------------------------------------------------------------------------  Chemistries  Recent Labs  Lab 03/23/18 1737  NA 137  K 4.1  CL 106  CO2 22  GLUCOSE 215*  BUN 49*  CREATININE 3.50*  CALCIUM 8.6*   ------------------------------------------------------------------------------------------------------------------  Cardiac Enzymes Recent Labs  Lab 03/23/18 1931  TROPONINI 0.04*   ------------------------------------------------------------------------------------------------------------------  RADIOLOGY:  Dg Chest 2 View  Result Date: 03/23/2018 CLINICAL DATA:  Mid chest pain a couple of hours ago. EXAM: CHEST - 2 VIEW COMPARISON:  06/20/2013 FINDINGS: The heart size and mediastinal contours are within normal limits. Nonaneurysmal atherosclerotic aorta with slight uncoiling is redemonstrated. Both lungs are clear. The visualized skeletal structures are unremarkable. IMPRESSION: No active cardiopulmonary disease. Aortic atherosclerosis (ICD10-I70.0) Electronically Signed   By: Ashley Royalty M.D.   On: 03/23/2018 17:56    EKG:   Normal sinus rhythm 88 bpm slight half millimeter ST depression laterally  IMPRESSION AND PLAN:   1.  Atypical chest pain with history of coronary artery disease.  Observe overnight get a third troponin and get a stress test  in the morning.  Monitor on telemetry.  Continue aspirin, statin and metoprolol. 2.  Chronic kidney disease stage IV.  Recommend nephrology consultation as outpatient. 3.  Accelerated hypertension.  Give Norvasc now 5 mg and continue daily. 4.  Impaired fasting glucose check a hemoglobin A1c put on sliding scale insulin. 5.  Hyperlipidemia unspecified on Lipitor.  Check a lipid profile tomorrow morning    All the records are reviewed and case discussed with ED provider. Management plans discussed with the patient, family and they are in agreement.  CODE STATUS: full code  TOTAL TIME TAKING CARE OF THIS PATIENT: 50 minutes, including ACP time.    Loletha Grayer M.D on 03/23/2018 at 9:37 PM  Between 7am to 6pm - Pager - [phone]  After 6pm call admission pager [phone]  [facility] Office  [phone]  CC: Primary care physician; Rusty Aus, MD

## 2018-03-24 ENCOUNTER — Other Ambulatory Visit: Payer: Self-pay

## 2018-03-24 LAB — CBC
HCT: 30.9 % — ABNORMAL LOW (ref 40.0–52.0)
Hemoglobin: 11.1 g/dL — ABNORMAL LOW (ref 13.0–18.0)
MCH: 33.8 pg (ref 26.0–34.0)
MCHC: 36.1 g/dL — ABNORMAL HIGH (ref 32.0–36.0)
MCV: 93.5 fL (ref 80.0–100.0)
Platelets: 205 10*3/uL (ref 150–440)
RBC: 3.3 MIL/uL — ABNORMAL LOW (ref 4.40–5.90)
RDW: 13.2 % (ref 11.5–14.5)
WBC: 7.9 10*3/uL (ref 3.8–10.6)

## 2018-03-24 LAB — LIPID PANEL
Cholesterol: 149 mg/dL (ref 0–200)
HDL: 66 mg/dL (ref >40)
LDL Cholesterol: 48 mg/dL (ref 0–99)
Total CHOL/HDL Ratio: 2.3 RATIO
Triglycerides: 173 mg/dL — ABNORMAL HIGH (ref <150)
VLDL: 35 mg/dL (ref 0–40)

## 2018-03-24 LAB — GLUCOSE, CAPILLARY: Glucose-Capillary: 104 mg/dL — ABNORMAL HIGH (ref 70–99)

## 2018-03-24 LAB — BASIC METABOLIC PANEL
Anion gap: 9 (ref 5–15)
BUN: 45 mg/dL — ABNORMAL HIGH (ref 8–23)
CO2: 21 mmol/L — ABNORMAL LOW (ref 22–32)
Calcium: 8.5 mg/dL — ABNORMAL LOW (ref 8.9–10.3)
Chloride: 112 mmol/L — ABNORMAL HIGH (ref 98–111)
Creatinine, Ser: 3.03 mg/dL — ABNORMAL HIGH (ref 0.61–1.24)
GFR calc Af Amer: 20 mL/min — ABNORMAL LOW (ref >60)
GFR calc non Af Amer: 18 mL/min — ABNORMAL LOW (ref >60)
Glucose, Bld: 98 mg/dL (ref 70–99)
Potassium: 3.8 mmol/L (ref 3.5–5.1)
Sodium: 142 mmol/L (ref 135–145)

## 2018-03-24 LAB — HEMOGLOBIN A1C
Hgb A1c MFr Bld: 5.8 % — ABNORMAL HIGH (ref 4.8–5.6)
Mean Plasma Glucose: 119.76 mg/dL

## 2018-03-24 NOTE — Consult Note (Signed)
Cardiology Consultation Note    Patient ID: Eddie Mullen, MRN: [id number], DOB/AGE: 08-28-1932 84 y.o. Admit date: 03/23/2018   Date of Consult: 03/24/2018 Primary Physician: Rusty Aus, MD Primary Cardiologist: Dr. Otto Herb, [facility]  Chief Complaint: chest pain Reason for Consultation: chest pain Requesting MD: Dr. Bridgett Larsson  HPI: Eddie Mullen is a 84 y.o. male with history of heart disease status post PCI.  History includes  04/29/2011. LHC: Multivessel PCI with DES to PDA, Mid RCA, and Mid LAD. PDA 2.25x18 Xience DES from 90% to 10 %; Mid RCA 2.75X28 Xience DES from 80% to 10 %; Mid LAD 2.25X12 Xience DES from 80 to 10%.  He was last seen by his primary cardiologist last week.  He has been doing fairly well though does have intermittent brief episodes of chest pain.  He is aggressively treated with amlodipine 2.5 mg 2 tablets in the morning 1 tablet in the evening, atorvastatin at 40 mg daily, isosorbide mononitrate at 30 mg daily, as needed sublingual nitroglycerin, metoprolol succinate 5 mg daily.  He awoke from sleep with some chest tightness.  He presented to our emergency room where evaluation included electric cardiogram showing no acute ischemia.  He had a borderline troponin I elevation to 0.09.  Pain was present for approximately 10 minutes.  He has chronic renal insufficient and had an acute on chronic exacerbation with a serum creatinine of 3.5 on presentation.  Currently is resting comfortably with no further chest pain.  Past Medical History:  Diagnosis Date  . CAD (coronary artery disease)   . Hypertension       Surgical History:  Past Surgical History:  Procedure Laterality Date  . CORONARY ANGIOPLASTY WITH STENT PLACEMENT    . HERNIA REPAIR       Home Meds: Prior to Admission medications   Medication Sig Start Date End Date Taking? Authorizing Provider  amLODipine (NORVASC) 2.5 MG tablet Take 2.5-5 mg by mouth daily. 5MG -AM,  2.5MG -PM 03/21/18  Yes [provider]  atorvastatin (LIPITOR) 40 MG tablet Take 1 tablet by mouth daily. 01/03/18  Yes [provider]  isosorbide mononitrate (IMDUR) 30 MG 24 hr tablet Take 1 tablet by mouth daily. 03/21/18  Yes [provider]  metoprolol succinate (TOPROL-XL) 50 MG 24 hr tablet Take 75 mg by mouth daily. 03/07/18  Yes [provider]  zolpidem (AMBIEN) 10 MG tablet Take 10 mg by mouth at bedtime as needed for sleep.   Yes [provider]  aspirin EC 81 MG tablet Take 1 tablet (81 mg total) by mouth daily. 03/23/18 03/23/19  Eddie Grayer, MD    Inpatient Medications:  . amLODipine  5 mg Oral Daily  . aspirin EC  81 mg Oral Daily  . atorvastatin  40 mg Oral QPM  . heparin  5,000 Units Subcutaneous Q8H  . insulin aspart  0-5 Units Subcutaneous QHS  . insulin aspart  0-9 Units Subcutaneous TID WC  . isosorbide mononitrate  30 mg Oral Daily  . metoprolol succinate  75 mg Oral Daily     Allergies:  Allergies  Allergen Reactions  . Penicillins Rash    Social History   Socioeconomic History  . Marital status: Married    Spouse name: Not on file  . Number of children: Not on file  . Years of education: Not on file  . Highest education level: Not on file  Occupational History  . Not on file  Social Needs  .  Financial resource strain: Not on file  . Food insecurity:    Worry: Not on file    Inability: Not on file  . Transportation needs:    Medical: Not on file    Non-medical: Not on file  Tobacco Use  . Smoking status: Never Smoker  . Smokeless tobacco: Never Used  Substance and Sexual Activity  . Alcohol use: Yes  . Drug use: Never  . Sexual activity: Not on file  Lifestyle  . Physical activity:    Days per week: Not on file    Minutes per session: Not on file  . Stress: Not on file  Relationships  . Social connections:    Talks on phone: Not on file    Gets together: Not on file    Attends religious service: Not on file    Active  member of club or organization: Not on file    Attends meetings of clubs or organizations: Not on file    Relationship status: Not on file  . Intimate partner violence:    Fear of current or ex partner: Not on file    Emotionally abused: Not on file    Physically abused: Not on file    Forced sexual activity: Not on file  Other Topics Concern  . Not on file  Social History Narrative  . Not on file     Family History  Problem Relation Age of Onset  . CVA Mother   . CAD Mother      Review of Systems: A 12-system review of systems was performed and is negative except as noted in the HPI.  Labs: Recent Labs    03/23/18 1737 03/23/18 1931 03/23/18 2319  TROPONINI <0.03 0.04* 0.09*   Lab Results  Component Value Date   WBC 7.9 03/24/2018   HGB 11.1 (L) 03/24/2018   HCT 30.9 (L) 03/24/2018   MCV 93.5 03/24/2018   PLT 205 03/24/2018    Recent Labs  Lab 03/24/18 0440  NA 142  K 3.8  CL 112*  CO2 21*  BUN 45*  CREATININE 3.03*  CALCIUM 8.5*  GLUCOSE 98   Lab Results  Component Value Date   CHOL 149 03/24/2018   HDL 66 03/24/2018   LDLCALC 48 03/24/2018   TRIG 173 (H) 03/24/2018   No results found for: DDIMER  Radiology/Studies:  Dg Chest 2 View  Result Date: 03/23/2018 CLINICAL DATA:  Mid chest pain a couple of hours ago. EXAM: CHEST - 2 VIEW COMPARISON:  06/20/2013 FINDINGS: The heart size and mediastinal contours are within normal limits. Nonaneurysmal atherosclerotic aorta with slight uncoiling is redemonstrated. Both lungs are clear. The visualized skeletal structures are unremarkable. IMPRESSION: No active cardiopulmonary disease. Aortic atherosclerosis (ICD10-I70.0) Electronically Signed   By: Ashley Royalty M.D.   On: 03/23/2018 17:56    Wt Readings from Last 3 Encounters:  03/23/18 64.4 kg    EKG: Sinus rhythm with nonspecific inferolateral ST-T wave changes.  Physical Exam: No apparent distress Blood pressure (!) 160/85, pulse 62, temperature 98.2  F (36.8 C), temperature source Oral, resp. rate 16, height 5\' 7"  (1.702 m), weight 64.4 kg, SpO2 100 %. Body mass index is 22.24 kg/m. General: Well developed, well nourished, in no acute distress. Head: Normocephalic, atraumatic, sclera non-icteric, no xanthomas, nares are without discharge.  Neck: Negative for carotid bruits. JVD not elevated. Lungs: Clear bilaterally to auscultation without wheezes, rales, or rhonchi. Breathing is unlabored. Heart: RRR with S1 S2. No murmurs, rubs, or gallops  appreciated. Abdomen: Soft, non-tender, non-distended with normoactive bowel sounds. No hepatomegaly. No rebound/guarding. No obvious abdominal masses. Msk:  Strength and tone appear normal for age. Extremities: No clubbing or cyanosis. No edema.  Distal pedal pulses are 2+ and equal bilaterally. Neuro: Alert and oriented X 3. No facial asymmetry. No focal deficit. Moves all extremities spontaneously. Psych:  Responds to questions appropriately with a normal affect.     Assessment and Plan  84 year old male with history of coronary disease status post multiple PCI's most recently in 2012.  Is followed at [facility].  Is on an excellent aggressive antianginal medical regimen.  He also has chronic renal insufficiency.  His baseline creatinine is approximately 2.5.  He was admitted with chest pain lasting approximately 10 minutes nonexertional.  He also had shortness of breath.  On presentation his electric cardiogram showed no acute changes.  Serum creatinine increased to 3.5.  Troponin peaked at 0.09.  He is currently pain-free.  Functional study not available today.  Patient is asymptomatic and would like to go home if possible.  We will ambulate the patient later this morning.  He is stable with no further chest pain would recommend discharge to home on current medical regimen.  Patient advised to limit activity to avoid symptoms and follow-up in 1 to 2 weeks with his primary  cardiologist.  Should he have further symptoms will need to return to the emergency room.  Signed, Teodoro Spray MD 03/24/2018, 10:12 AM Pager: [phone]

## 2018-03-24 NOTE — Progress Notes (Signed)
Pt to be discharged this am. Iv and tele removed. disch instructions given to pt to his understanding. disch via w.c. . To awaiting car.

## 2018-03-24 NOTE — Discharge Summary (Signed)
[facility] NAME: Eddie Mullen    MR#:  [id number]  DATE OF BIRTH:  1933-04-28  DATE OF ADMISSION:  03/23/2018   ADMITTING PHYSICIAN: Loletha Grayer, MD  DATE OF DISCHARGE: 03/24/2018 11:26 AM  PRIMARY CARE PHYSICIAN: Rusty Aus, MD   ADMISSION DIAGNOSIS:  Chest pain, unspecified type [R07.9] DISCHARGE DIAGNOSIS:  Active Problems:   Chest pain  SECONDARY DIAGNOSIS:   Past Medical History:  Diagnosis Date  . CAD (coronary artery disease)   . Hypertension    HOSPITAL COURSE:  1.  Atypical chest pain with history of coronary artery disease.   Continue aspirin, statin and metoprolol. Functional study not available today. He is stable with no further chest pain would recommend discharge to home on current medical regimen per Dr. Ubaldo Glassing.  2.  Chronic kidney disease stage IV.  Recommend nephrology consultation as outpatient. 3.  Accelerated hypertension.    Continue Norvasc and Lopressor. 4.  Impaired fasting glucose check a hemoglobin A1c 5.8. 5.  Hyperlipidemia unspecified on Lipitor.  DISCHARGE CONDITIONS:  Stable, discharge to home today. CONSULTS OBTAINED:  Treatment Team:  Teodoro Spray, MD DRUG ALLERGIES:   Allergies  Allergen Reactions  . Penicillins Rash   DISCHARGE MEDICATIONS:   Allergies as of 03/24/2018      Reactions   Penicillins Rash      Medication List    TAKE these medications   amLODipine 2.5 MG tablet Commonly known as:  NORVASC Take 2.5-5 mg by mouth daily. 5MG -AM,  2.5MG -PM   aspirin EC 81 MG tablet Take 1 tablet (81 mg total) by mouth daily.   atorvastatin 40 MG tablet Commonly known as:  LIPITOR Take 1 tablet by mouth daily.   isosorbide mononitrate 30 MG 24 hr tablet Commonly known as:  IMDUR Take 1 tablet by mouth daily.   metoprolol succinate 50 MG 24 hr tablet Commonly known as:  TOPROL-XL Take 75 mg by mouth daily.   zolpidem 10 MG tablet Commonly known as:   AMBIEN Take 10 mg by mouth at bedtime as needed for sleep.        DISCHARGE INSTRUCTIONS:  See AVS.  If you experience worsening of your admission symptoms, develop shortness of breath, life threatening emergency, suicidal or homicidal thoughts you must seek medical attention immediately by calling 911 or calling your MD immediately  if symptoms less severe.  You Must read complete instructions/literature along with all the possible adverse reactions/side effects for all the Medicines you take and that have been prescribed to you. Take any new Medicines after you have completely understood and accpet all the possible adverse reactions/side effects.   Please note  You were cared for by a hospitalist during your hospital stay. If you have any questions about your discharge medications or the care you received while you were in the hospital after you are discharged, you can call the unit and asked to speak with the hospitalist on call if the hospitalist that took care of you is not available. Once you are discharged, your primary care physician will handle any further medical issues. Please note that NO REFILLS for any discharge medications will be authorized once you are discharged, as it is imperative that you return to your primary care physician (or establish a relationship with a primary care physician if you do not have one) for your aftercare needs so that they can reassess your need for medications and monitor your lab values.  On the day of Discharge:  VITAL SIGNS:  Blood pressure (!) 160/85, pulse 62, temperature 98.2 F (36.8 C), temperature source Oral, resp. rate 16, height 5\' 7"  (1.702 m), weight 64.4 kg, SpO2 100 %. PHYSICAL EXAMINATION:  GENERAL:  84 y.o.-year-old patient lying in the bed with no acute distress.  EYES: Pupils equal, round, reactive to light and accommodation. No scleral icterus. Extraocular muscles intact.  HEENT: Head atraumatic, normocephalic. Oropharynx  and nasopharynx clear.  NECK:  Supple, no jugular venous distention. No thyroid enlargement, no tenderness.  LUNGS: Normal breath sounds bilaterally, no wheezing, rales,rhonchi or crepitation. No use of accessory muscles of respiration.  CARDIOVASCULAR: S1, S2 normal. No murmurs, rubs, or gallops.  ABDOMEN: Soft, non-tender, non-distended. Bowel sounds present. No organomegaly or mass.  EXTREMITIES: No pedal edema, cyanosis, or clubbing.  NEUROLOGIC: Cranial nerves II through XII are intact. Muscle strength 5/5 in all extremities. Sensation intact. Gait not checked.  PSYCHIATRIC: The patient is alert and oriented x 3.  SKIN: No obvious rash, lesion, or ulcer.  DATA REVIEW:   CBC Recent Labs  Lab 03/24/18 0440  WBC 7.9  HGB 11.1*  HCT 30.9*  PLT 205    Chemistries  Recent Labs  Lab 03/24/18 0440  NA 142  K 3.8  CL 112*  CO2 21*  GLUCOSE 98  BUN 45*  CREATININE 3.03*  CALCIUM 8.5*     Microbiology Results  No results found for this or any previous visit.  RADIOLOGY:  Dg Chest 2 View  Result Date: 03/23/2018 CLINICAL DATA:  Mid chest pain a couple of hours ago. EXAM: CHEST - 2 VIEW COMPARISON:  06/20/2013 FINDINGS: The heart size and mediastinal contours are within normal limits. Nonaneurysmal atherosclerotic aorta with slight uncoiling is redemonstrated. Both lungs are clear. The visualized skeletal structures are unremarkable. IMPRESSION: No active cardiopulmonary disease. Aortic atherosclerosis (ICD10-I70.0) Electronically Signed   By: Ashley Royalty M.D.   On: 03/23/2018 17:56     Management plans discussed with the patient, family and they are in agreement.  CODE STATUS: DNR   TOTAL TIME TAKING CARE OF THIS PATIENT: 25 minutes.    Demetrios Loll M.D on 03/24/2018 at 2:04 PM  Between 7am to 6pm - Pager - [phone]  After 6pm go to [email or web address] - Proofreader  [facility]  Office  [phone]  CC: Primary care physician;  Rusty Aus, MD   Note: This dictation was prepared with Dragon dictation along with smaller phrase technology. Any transcriptional errors that result from this process are unintentional.

## 2018-03-24 NOTE — Progress Notes (Signed)
Nuclear medicine reports that they only do two stress tests on Saturday so they will be unable to do [name]'s lexiscan until Tuesday.

## 2019-07-30 DIAGNOSIS — I700 Atherosclerosis of aorta: Secondary | ICD-10-CM | POA: Insufficient documentation

## 2019-09-26 ENCOUNTER — Ambulatory Visit: Payer: Medicare Other

## 2019-09-26 ENCOUNTER — Ambulatory Visit: Payer: Medicare Other | Attending: Internal Medicine

## 2019-09-26 DIAGNOSIS — Z23 Encounter for immunization: Secondary | ICD-10-CM | POA: Insufficient documentation

## 2019-09-26 NOTE — Progress Notes (Signed)
   Covid-19 Vaccination Clinic  Name:  Eddie Mullen    MRN: [id number] DOB: [birth year]  09/26/2019  [name] was observed post Covid-19 immunization for 15 minutes without incident. He was provided with Vaccine Information Sheet and instruction to access the V-Safe system.   [name] was instructed to call 911 with any severe reactions post vaccine: Marland Kitchen Difficulty breathing  . Swelling of face and throat  . A fast heartbeat  . A bad rash all over body  . Dizziness and weakness   Immunizations Administered    Name Date Dose VIS Date Route   Pfizer COVID-19 Vaccine 09/26/2019 12:19 PM 0.3 mL 07/05/2019 Intramuscular   Manufacturer: [place]   Lot: [id number]   [place]: [id number]

## 2019-09-27 ENCOUNTER — Ambulatory Visit: Payer: Medicare Other

## 2019-10-03 DIAGNOSIS — N184 Chronic kidney disease, stage 4 (severe): Secondary | ICD-10-CM | POA: Diagnosis present

## 2019-10-03 DIAGNOSIS — D631 Anemia in chronic kidney disease: Secondary | ICD-10-CM | POA: Diagnosis present

## 2019-10-14 ENCOUNTER — Other Ambulatory Visit: Payer: Self-pay

## 2019-10-14 ENCOUNTER — Inpatient Hospital Stay: Payer: Medicare Other | Attending: Oncology | Admitting: Oncology

## 2019-10-14 ENCOUNTER — Encounter: Payer: Self-pay | Admitting: Oncology

## 2019-10-14 VITALS — BP 176/93 | HR 67 | Temp 95.9°F | Wt 134.0 lb

## 2019-10-14 DIAGNOSIS — D509 Iron deficiency anemia, unspecified: Secondary | ICD-10-CM | POA: Insufficient documentation

## 2019-10-14 DIAGNOSIS — Z79899 Other long term (current) drug therapy: Secondary | ICD-10-CM | POA: Diagnosis not present

## 2019-10-14 DIAGNOSIS — N189 Chronic kidney disease, unspecified: Secondary | ICD-10-CM

## 2019-10-14 DIAGNOSIS — D631 Anemia in chronic kidney disease: Secondary | ICD-10-CM

## 2019-10-14 DIAGNOSIS — I2510 Atherosclerotic heart disease of native coronary artery without angina pectoris: Secondary | ICD-10-CM | POA: Insufficient documentation

## 2019-10-14 DIAGNOSIS — E785 Hyperlipidemia, unspecified: Secondary | ICD-10-CM | POA: Diagnosis not present

## 2019-10-14 DIAGNOSIS — N183 Chronic kidney disease, stage 3 unspecified: Secondary | ICD-10-CM | POA: Diagnosis not present

## 2019-10-14 DIAGNOSIS — I10 Essential (primary) hypertension: Secondary | ICD-10-CM | POA: Insufficient documentation

## 2019-10-14 DIAGNOSIS — Z8249 Family history of ischemic heart disease and other diseases of the circulatory system: Secondary | ICD-10-CM | POA: Diagnosis not present

## 2019-10-14 NOTE — Progress Notes (Signed)
Patient is here today to establish care for his iron deficiency anemia. Patient is currently taking ferrous sulfate once a week since it makes him constipated.

## 2019-10-17 ENCOUNTER — Encounter: Payer: Self-pay | Admitting: Oncology

## 2019-10-17 ENCOUNTER — Ambulatory Visit: Payer: Medicare Other | Attending: Internal Medicine

## 2019-10-17 DIAGNOSIS — Z23 Encounter for immunization: Secondary | ICD-10-CM

## 2019-10-17 NOTE — Progress Notes (Signed)
   Covid-19 Vaccination Clinic  Name:  Eddie Mullen    MRN: [id number] DOB: 08-Aug-1932  10/17/2019  [name] was observed post Covid-19 immunization for 15 minutes without incident. He was provided with Vaccine Information Sheet and instruction to access the V-Safe system.   [name] was instructed to call 911 with any severe reactions post vaccine: Marland Kitchen Difficulty breathing  . Swelling of face and throat  . A fast heartbeat  . A bad rash all over body  . Dizziness and weakness   Immunizations Administered    Name Date Dose VIS Date Route   Pfizer COVID-19 Vaccine 10/17/2019 11:38 AM 0.3 mL 07/05/2019 Intramuscular   Manufacturer: [place]   Lot: [id number]   [place]: [id number]

## 2019-10-17 NOTE — Progress Notes (Signed)
Hematology/Oncology Consult note [facility] Telephone:([phone] [phone]  Patient Care Team: Rusty Aus, MD as PCP - General (Internal Medicine)   Name of the patient: Eddie Mullen  [id number]  23-Oct-1932    Reason for referral-anemia   Referring physician-Dr. Emily Filbert  Date of visit: 10/17/19   History of presenting illness- Patient is a 86 year old male with a past medical history significant for stage III CKD hypertension hyperlipidemia and Barrett's esophagus among other medical problems.  Patient's most recent CBC showed hemoglobin of 9.7.  His white cell count and platelets have been normal.  Prior to that patient's hemoglobin was mostly between 11-12 up until February 2020 and since then there has been a slow decline.  Patient's baseline creatinine is between 3-4.  Most recent ferritin on 10/03/2019 was 31.  TSH in January 2021 was normal at 3.9.  B12 levels in January were 374.  Patient has recently started taking oral iron about 2 weeks ago.  He generally reports feeling well and other than mild fatigue he denies other complaints at this time  ECOG PS- 1  Pain scale- 0   Review of systems- Review of Systems  Constitutional: Positive for malaise/fatigue. Negative for chills, fever and weight loss.  HENT: Negative for congestion, ear discharge and nosebleeds.   Eyes: Negative for blurred vision.  Respiratory: Negative for cough, hemoptysis, sputum production, shortness of breath and wheezing.   Cardiovascular: Negative for chest pain, palpitations, orthopnea and claudication.  Gastrointestinal: Negative for abdominal pain, blood in stool, constipation, diarrhea, heartburn, melena, nausea and vomiting.  Genitourinary: Negative for dysuria, flank pain, frequency, hematuria and urgency.  Musculoskeletal: Negative for back pain, joint pain and myalgias.  Skin: Negative for rash.  Neurological: Negative for dizziness, tingling, focal  weakness, seizures, weakness and headaches.  Endo/Heme/Allergies: Does not bruise/bleed easily.  Psychiatric/Behavioral: Negative for depression and suicidal ideas. The patient does not have insomnia.     Allergies  Allergen Reactions  . Isosorbide     Other reaction(s): Dizziness  . Other Anaphylaxis    Stinging insects-any  . Ace Inhibitors     Other reaction(s): Unknown Proteinuria  . Penicillin G Rash    Other reaction(s): Headache  . Penicillins Rash    Patient Active Problem List   Diagnosis Date Noted  . Iron deficiency anemia 10/14/2019  . Chest pain 03/23/2018     Past Medical History:  Diagnosis Date  . CAD (coronary artery disease)   . Hypertension      Past Surgical History:  Procedure Laterality Date  . CORONARY ANGIOPLASTY WITH STENT PLACEMENT    . HERNIA REPAIR      Social History   Socioeconomic History  . Marital status: Married    Spouse name: Not on file  . Number of children: Not on file  . Years of education: Not on file  . Highest education level: Not on file  Occupational History  . Not on file  Tobacco Use  . Smoking status: Never Smoker  . Smokeless tobacco: Never Used  Substance and Sexual Activity  . Alcohol use: Yes  . Drug use: Never  . Sexual activity: Not on file  Other Topics Concern  . Not on file  Social History Narrative  . Not on file   Social Determinants of Health   Financial Resource Strain:   . Difficulty of Paying Living Expenses:   Food Insecurity:   . Worried About Charity fundraiser in the Last Year:   .  Ran Out of Food in the Last Year:   Transportation Needs:   . Film/video editor (Medical):   Marland Kitchen Lack of Transportation (Non-Medical):   Physical Activity:   . Days of Exercise per Week:   . Minutes of Exercise per Session:   Stress:   . Feeling of Stress :   Social Connections:   . Frequency of Communication with Friends and Family:   . Frequency of Social Gatherings with Friends and Family:    . Attends Religious Services:   . Active Member of Clubs or Organizations:   . Attends Archivist Meetings:   Marland Kitchen Marital Status:   Intimate Partner Violence:   . Fear of Current or Ex-Partner:   . Emotionally Abused:   Marland Kitchen Physically Abused:   . Sexually Abused:      Family History  Problem Relation Age of Onset  . CVA Mother   . CAD Mother      Current Outpatient Medications:  .  Acetaminophen 500 MG coapsule, Take 2 tablets by mouth as needed., Disp: , Rfl:  .  atorvastatin (LIPITOR) 40 MG tablet, Take 1 tablet by mouth daily., Disp: , Rfl:  .  ferrous sulfate 325 (65 FE) MG tablet, Take 325 mg by mouth once a week., Disp: , Rfl:  .  isosorbide mononitrate (IMDUR) 30 MG 24 hr tablet, Take 1 tablet by mouth daily., Disp: , Rfl:  .  metoprolol succinate (TOPROL-XL) 50 MG 24 hr tablet, Take 75 mg by mouth daily., Disp: , Rfl:  .  nitroGLYCERIN (NITROSTAT) 0.4 MG SL tablet, Place 1 tablet under the tongue., Disp: , Rfl:  .  sertraline (ZOLOFT) 50 MG tablet, Take 1 tablet by mouth daily., Disp: , Rfl:  .  zolpidem (AMBIEN) 10 MG tablet, Take 10 mg by mouth at bedtime as needed for sleep., Disp: , Rfl:    Physical exam:  Vitals:   10/14/19 1335  BP: (!) 176/93  Pulse: 67  Temp: (!) 95.9 F (35.5 C)  TempSrc: Tympanic  Weight: 134 lb (60.8 kg)   Physical Exam HENT:     Head: Normocephalic and atraumatic.  Cardiovascular:     Rate and Rhythm: Normal rate and regular rhythm.     Heart sounds: Normal heart sounds.  Pulmonary:     Effort: Pulmonary effort is normal.     Breath sounds: Normal breath sounds.  Abdominal:     General: Bowel sounds are normal.     Palpations: Abdomen is soft.  Skin:    General: Skin is warm and dry.  Neurological:     Mental Status: He is alert and oriented to person, place, and time.        CMP Latest Ref Rng & Units 03/24/2018  Glucose 70 - 99 mg/dL 98  BUN 8 - 23 mg/dL 45(H)  Creatinine 0.61 - 1.24 mg/dL 3.03(H)  Sodium  135 - 145 mmol/L 142  Potassium 3.5 - 5.1 mmol/L 3.8  Chloride 98 - 111 mmol/L 112(H)  CO2 22 - 32 mmol/L 21(L)  Calcium 8.9 - 10.3 mg/dL 8.5(L)   CBC Latest Ref Rng & Units 03/24/2018  WBC 3.8 - 10.6 K/uL 7.9  Hemoglobin 13.0 - 18.0 g/dL 11.1(L)  Hematocrit 40.0 - 52.0 % 30.9(L)  Platelets 150 - 440 K/uL 205     Assessment and plan- Patient is a 86 y.o. male referred for normocytic anemia possibly secondary to anemia of chronic kidney disease as well as iron deficiency  Patient has baseline  CKD with a creatinine between 3-4 that has remained stable.  His hemoglobin is slowly drifted down from his baseline of 11-9.3 over the last 1 year.  His recent blood work reveals a low ferritin of 31.  B12 and TSH were normal.  We discussed trying IV iron to see if that would improve his hemoglobin versus continuing oral iron for another 2 months.  Patient would like to continue oral iron at this time.  I will bring him back in for repeat labs in about 6 weeks time CBC ferritin and iron studies myeloma panel and serum free light chains and folic acid level.  I will have a video visit with him following that.  Goal ferritin with anemia of kidney disease would be closer to 200.  If patient's hemoglobin remains significantly below 10 despite that, trial of EPO could be considered   Thank you for this kind referral and the opportunity to participate in the care of this patient   Visit Diagnosis 1. Anemia of chronic kidney failure, unspecified stage   2. Iron deficiency anemia, unspecified iron deficiency anemia type     Dr. Randa Evens, MD, MPH [facility] at [facility] [phone] 10/17/2019  8:33 AM

## 2019-11-25 ENCOUNTER — Other Ambulatory Visit: Payer: Self-pay

## 2019-11-25 ENCOUNTER — Inpatient Hospital Stay: Payer: Medicare Other | Attending: Oncology

## 2019-11-25 DIAGNOSIS — D509 Iron deficiency anemia, unspecified: Secondary | ICD-10-CM | POA: Diagnosis not present

## 2019-11-25 DIAGNOSIS — D631 Anemia in chronic kidney disease: Secondary | ICD-10-CM | POA: Diagnosis present

## 2019-11-25 DIAGNOSIS — N189 Chronic kidney disease, unspecified: Secondary | ICD-10-CM | POA: Diagnosis not present

## 2019-11-25 LAB — CBC WITH DIFFERENTIAL/PLATELET
Abs Immature Granulocytes: 0.03 10*3/uL (ref 0.00–0.07)
Basophils Absolute: 0.1 10*3/uL (ref 0.0–0.1)
Basophils Relative: 1 %
Eosinophils Absolute: 0.1 10*3/uL (ref 0.0–0.5)
Eosinophils Relative: 1 %
HCT: 28.6 % — ABNORMAL LOW (ref 39.0–52.0)
Hemoglobin: 9.8 g/dL — ABNORMAL LOW (ref 13.0–17.0)
Immature Granulocytes: 1 %
Lymphocytes Relative: 10 %
Lymphs Abs: 0.7 10*3/uL (ref 0.7–4.0)
MCH: 33 pg (ref 26.0–34.0)
MCHC: 34.3 g/dL (ref 30.0–36.0)
MCV: 96.3 fL (ref 80.0–100.0)
Monocytes Absolute: 0.6 10*3/uL (ref 0.1–1.0)
Monocytes Relative: 10 %
Neutro Abs: 4.9 10*3/uL (ref 1.7–7.7)
Neutrophils Relative %: 77 %
Platelets: 215 10*3/uL (ref 150–400)
RBC: 2.97 MIL/uL — ABNORMAL LOW (ref 4.22–5.81)
RDW: 13.1 % (ref 11.5–15.5)
WBC: 6.3 10*3/uL (ref 4.0–10.5)
nRBC: 0 % (ref 0.0–0.2)

## 2019-11-25 LAB — IRON AND TIBC
Iron: 89 ug/dL (ref 45–182)
Saturation Ratios: 26 % (ref 17.9–39.5)
TIBC: 349 ug/dL (ref 250–450)
UIBC: 260 ug/dL

## 2019-11-25 LAB — VITAMIN B12: Vitamin B-12: 383 pg/mL (ref 180–914)

## 2019-11-25 LAB — FOLATE: Folate: 10.6 ng/mL (ref >5.9)

## 2019-11-25 LAB — FERRITIN: Ferritin: 23 ng/mL — ABNORMAL LOW (ref 24–336)

## 2019-11-26 ENCOUNTER — Encounter: Payer: Self-pay | Admitting: Oncology

## 2019-11-26 ENCOUNTER — Inpatient Hospital Stay: Payer: Medicare Other | Admitting: Oncology

## 2019-11-26 ENCOUNTER — Telehealth: Payer: Self-pay | Admitting: Oncology

## 2019-11-26 LAB — KAPPA/LAMBDA LIGHT CHAINS
Kappa free light chain: 38 mg/L — ABNORMAL HIGH (ref 3.3–19.4)
Kappa, lambda light chain ratio: 1.34 (ref 0.26–1.65)
Lambda free light chains: 28.4 mg/L — ABNORMAL HIGH (ref 5.7–26.3)

## 2019-11-26 NOTE — Telephone Encounter (Signed)
Patient missed appt on 11-26-19. Writer phoned patient on this date and left voicemail to reschedule. Letter also mailed on this date requesting patient phone [place] to reschedule.

## 2019-11-27 LAB — MULTIPLE MYELOMA PANEL, SERUM
Albumin SerPl Elph-Mcnc: 3.6 g/dL (ref 2.9–4.4)
Albumin/Glob SerPl: 1.5 (ref 0.7–1.7)
Alpha 1: 0.2 g/dL (ref 0.0–0.4)
Alpha2 Glob SerPl Elph-Mcnc: 0.8 g/dL (ref 0.4–1.0)
B-Globulin SerPl Elph-Mcnc: 0.9 g/dL (ref 0.7–1.3)
Gamma Glob SerPl Elph-Mcnc: 0.6 g/dL (ref 0.4–1.8)
Globulin, Total: 2.5 g/dL (ref 2.2–3.9)
IgA: 108 mg/dL (ref 61–437)
IgG (Immunoglobin G), Serum: 521 mg/dL — ABNORMAL LOW (ref 603–1613)
IgM (Immunoglobulin M), Srm: 19 mg/dL (ref 15–143)
Total Protein ELP: 6.1 g/dL (ref 6.0–8.5)

## 2019-11-28 ENCOUNTER — Other Ambulatory Visit: Payer: Self-pay

## 2019-11-28 DIAGNOSIS — D509 Iron deficiency anemia, unspecified: Secondary | ICD-10-CM

## 2019-11-29 ENCOUNTER — Other Ambulatory Visit: Payer: Self-pay | Admitting: *Deleted

## 2019-11-29 ENCOUNTER — Encounter: Payer: Self-pay | Admitting: Oncology

## 2019-11-29 ENCOUNTER — Inpatient Hospital Stay (HOSPITAL_BASED_OUTPATIENT_CLINIC_OR_DEPARTMENT_OTHER): Payer: Medicare Other | Admitting: Oncology

## 2019-11-29 ENCOUNTER — Other Ambulatory Visit: Payer: Self-pay

## 2019-11-29 VITALS — BP 167/65 | HR 60 | Temp 96.5°F | Resp 18 | Wt 135.2 lb

## 2019-11-29 DIAGNOSIS — D509 Iron deficiency anemia, unspecified: Secondary | ICD-10-CM | POA: Diagnosis not present

## 2019-11-29 DIAGNOSIS — N189 Chronic kidney disease, unspecified: Secondary | ICD-10-CM

## 2019-11-29 DIAGNOSIS — D631 Anemia in chronic kidney disease: Secondary | ICD-10-CM | POA: Diagnosis not present

## 2019-11-29 NOTE — Progress Notes (Signed)
Patient here for oncology follow-up appointment, expresses no complaints or concerns at this time.    

## 2019-12-03 NOTE — Progress Notes (Signed)
Hematology/Oncology Consult note [facility]  Telephone:([phone] [phone]  Patient Care Team: Rusty Aus, MD as PCP - General (Internal Medicine)   Name of the patient: Eddie Mullen  [id number]  [birth year]   Date of visit: 12/03/19  Diagnosis-anemia likely secondary to chronic kidney disease  Chief complaint/ Reason for visit-discussed results of blood work  Heme/Onc history: Patient is a 86 year old male with a past medical history significant for stage III CKD hypertension hyperlipidemia and Barrett's esophagus among other medical problems.  Patient's most recent CBC showed hemoglobin of 9.7.  His white cell count and platelets have been normal.  Prior to that patient's hemoglobin was mostly between 11-12 up until February 2020 and since then there has been a slow decline.  Patient's baseline creatinine is between 3-4.  Most recent ferritin on 10/03/2019 was 31.  TSH in January 2021 was normal at 3.9.  B12 levels in January were 374.    Patient started taking oral iron since early March 2021  Results of blood work from 11/25/2019 were as follows: CBC showed a white cell count of 6.3, H&H of 9.8/28.6 and a normal platelet count.  Ferritin levels were low at 23.  Iron studies were normal.  Myeloma panel did not show any M protein.  Serum free light chains both kappa and lambda were elevated with a normal free light chain ratio 1.3.  B12 levels normal at 383 and folate was normal.  Interval history-patient currently feels well and other than mild fatigue he denies any complaints at this time  ECOG PS- 1 Pain scale- 0   Review of systems- Review of Systems  Constitutional: Negative for chills, fever, malaise/fatigue and weight loss.  HENT: Negative for congestion, ear discharge and nosebleeds.   Eyes: Negative for blurred vision.  Respiratory: Negative for cough, hemoptysis, sputum production, shortness of breath and wheezing.     Cardiovascular: Negative for chest pain, palpitations, orthopnea and claudication.  Gastrointestinal: Negative for abdominal pain, blood in stool, constipation, diarrhea, heartburn, melena, nausea and vomiting.  Genitourinary: Negative for dysuria, flank pain, frequency, hematuria and urgency.  Musculoskeletal: Negative for back pain, joint pain and myalgias.  Skin: Negative for rash.  Neurological: Negative for dizziness, tingling, focal weakness, seizures, weakness and headaches.  Endo/Heme/Allergies: Does not bruise/bleed easily.  Psychiatric/Behavioral: Negative for depression and suicidal ideas. The patient does not have insomnia.       Allergies  Allergen Reactions  . Isosorbide     Other reaction(s): Dizziness  . Other Anaphylaxis    Stinging insects-any  . Ace Inhibitors     Other reaction(s): Unknown Proteinuria  . Penicillin G Rash    Other reaction(s): Headache  . Penicillins Rash     Past Medical History:  Diagnosis Date  . CAD (coronary artery disease)   . Hypertension      Past Surgical History:  Procedure Laterality Date  . CORONARY ANGIOPLASTY WITH STENT PLACEMENT    . HERNIA REPAIR      Social History   Socioeconomic History  . Marital status: Married    Spouse name: Not on file  . Number of children: Not on file  . Years of education: Not on file  . Highest education level: Not on file  Occupational History  . Not on file  Tobacco Use  . Smoking status: Never Smoker  . Smokeless tobacco: Never Used  Substance and Sexual Activity  . Alcohol use: Yes  . Drug use: Never  .  Sexual activity: Not on file  Other Topics Concern  . Not on file  Social History Narrative  . Not on file   Social Determinants of Health   Financial Resource Strain:   . Difficulty of Paying Living Expenses:   Food Insecurity:   . Worried About Charity fundraiser in the Last Year:   . Arboriculturist in the Last Year:   Transportation Needs:   . Lexicographer (Medical):   Marland Kitchen Lack of Transportation (Non-Medical):   Physical Activity:   . Days of Exercise per Week:   . Minutes of Exercise per Session:   Stress:   . Feeling of Stress :   Social Connections:   . Frequency of Communication with Friends and Family:   . Frequency of Social Gatherings with Friends and Family:   . Attends Religious Services:   . Active Member of Clubs or Organizations:   . Attends Archivist Meetings:   Marland Kitchen Marital Status:   Intimate Partner Violence:   . Fear of Current or Ex-Partner:   . Emotionally Abused:   Marland Kitchen Physically Abused:   . Sexually Abused:     Family History  Problem Relation Age of Onset  . CVA Mother   . CAD Mother      Current Outpatient Medications:  .  Acetaminophen 500 MG coapsule, Take 2 tablets by mouth as needed., Disp: , Rfl:  .  atorvastatin (LIPITOR) 40 MG tablet, Take 1 tablet by mouth daily., Disp: , Rfl:  .  ferrous sulfate 325 (65 FE) MG tablet, Take 325 mg by mouth once a week., Disp: , Rfl:  .  isosorbide mononitrate (IMDUR) 30 MG 24 hr tablet, Take 1 tablet by mouth daily., Disp: , Rfl:  .  metoprolol succinate (TOPROL-XL) 50 MG 24 hr tablet, Take 75 mg by mouth daily., Disp: , Rfl:  .  sertraline (ZOLOFT) 50 MG tablet, Take 1 tablet by mouth daily., Disp: , Rfl:  .  zolpidem (AMBIEN) 10 MG tablet, Take 10 mg by mouth at bedtime as needed for sleep., Disp: , Rfl:  .  nitroGLYCERIN (NITROSTAT) 0.4 MG SL tablet, Place 1 tablet under the tongue., Disp: , Rfl:   Physical exam:  Vitals:   11/29/19 1341  BP: (!) 167/65  Pulse: 60  Resp: 18  Temp: (!) 96.5 F (35.8 C)  TempSrc: Tympanic  SpO2: 100%  Weight: 135 lb 3.2 oz (61.3 kg)   Physical Exam Cardiovascular:     Rate and Rhythm: Normal rate and regular rhythm.     Heart sounds: Normal heart sounds.  Pulmonary:     Effort: Pulmonary effort is normal.     Breath sounds: Normal breath sounds.  Abdominal:     General: Bowel sounds are normal.       Palpations: Abdomen is soft.  Skin:    General: Skin is warm and dry.  Neurological:     Mental Status: He is alert and oriented to person, place, and time.      CMP Latest Ref Rng & Units 03/24/2018  Glucose 70 - 99 mg/dL 98  BUN 8 - 23 mg/dL 45(H)  Creatinine 0.61 - 1.24 mg/dL 3.03(H)  Sodium 135 - 145 mmol/L 142  Potassium 3.5 - 5.1 mmol/L 3.8  Chloride 98 - 111 mmol/L 112(H)  CO2 22 - 32 mmol/L 21(L)  Calcium 8.9 - 10.3 mg/dL 8.5(L)   CBC Latest Ref Rng & Units 11/25/2019  WBC 4.0 - 10.5  K/uL 6.3  Hemoglobin 13.0 - 17.0 g/dL 9.8(L)  Hematocrit 39.0 - 52.0 % 28.6(L)  Platelets 150 - 400 K/uL 215    Assessment and plan- Patient is a 86 y.o. male referred for anemia likely a combination of anemia of kidney disease and iron deficiency  Patient has been on oral iron for about 2 months now.  No significant improvement in his hemoglobin which remains around 9.8.  Ferritin levels are still low.  We again discussed trying IV iron.  Patient would like to continue oral iron longer to see if it would help.  We agreed to continue oral iron for 2 more months and if his ferritin levels are no better we will proceed with IV iron at that time   Visit Diagnosis 1. Anemia of chronic kidney failure, unspecified stage   2. Iron deficiency anemia, unspecified iron deficiency anemia type      Dr. Randa Evens, MD, MPH [facility] at [facility] [phone] 12/03/2019 1:07 PM

## 2020-01-28 ENCOUNTER — Other Ambulatory Visit: Payer: Self-pay

## 2020-01-28 ENCOUNTER — Inpatient Hospital Stay (HOSPITAL_BASED_OUTPATIENT_CLINIC_OR_DEPARTMENT_OTHER): Payer: Medicare Other | Admitting: Oncology

## 2020-01-28 ENCOUNTER — Encounter: Payer: Self-pay | Admitting: Oncology

## 2020-01-28 ENCOUNTER — Inpatient Hospital Stay: Payer: Medicare Other | Attending: Oncology

## 2020-01-28 VITALS — BP 146/74 | HR 88 | Temp 97.9°F | Wt 135.0 lb

## 2020-01-28 DIAGNOSIS — D509 Iron deficiency anemia, unspecified: Secondary | ICD-10-CM

## 2020-01-28 DIAGNOSIS — D631 Anemia in chronic kidney disease: Secondary | ICD-10-CM | POA: Diagnosis present

## 2020-01-28 DIAGNOSIS — N183 Chronic kidney disease, stage 3 unspecified: Secondary | ICD-10-CM | POA: Insufficient documentation

## 2020-01-28 LAB — CBC
HCT: 27.9 % — ABNORMAL LOW (ref 39.0–52.0)
Hemoglobin: 10.1 g/dL — ABNORMAL LOW (ref 13.0–17.0)
MCH: 32.9 pg (ref 26.0–34.0)
MCHC: 36.2 g/dL — ABNORMAL HIGH (ref 30.0–36.0)
MCV: 90.9 fL (ref 80.0–100.0)
Platelets: 214 10*3/uL (ref 150–400)
RBC: 3.07 MIL/uL — ABNORMAL LOW (ref 4.22–5.81)
RDW: 13.3 % (ref 11.5–15.5)
WBC: 6.9 10*3/uL (ref 4.0–10.5)
nRBC: 0 % (ref 0.0–0.2)

## 2020-01-28 LAB — IRON AND TIBC
Iron: 117 ug/dL (ref 45–182)
Saturation Ratios: 40 % — ABNORMAL HIGH (ref 17.9–39.5)
TIBC: 294 ug/dL (ref 250–450)
UIBC: 177 ug/dL

## 2020-01-28 LAB — FERRITIN: Ferritin: 44 ng/mL (ref 24–336)

## 2020-01-28 NOTE — Progress Notes (Signed)
Patient denies any concerns today.  

## 2020-01-29 NOTE — Progress Notes (Signed)
Hematology/Oncology Consult note [facility]  Telephone:([phone] [phone]  Patient Care Team: Rusty Aus, MD as PCP - General (Internal Medicine)   Name of the patient: Eddie Mullen  [id number]  1932/12/13   Date of visit: 01/29/20  Diagnosis-anemia secondary to chronic kidney disease as well as component of iron deficiency  Chief complaint/ Reason for visit-routine follow-up of iron deficiency anemia  Heme/Onc history:  Patient is a 86 year old male with a past medical history significant for stage III CKD hypertension hyperlipidemia and Barrett's esophagus among other medical problems. Patient's most recent CBC showed hemoglobin of 9.7. His white cell count and platelets have been normal. Prior to that patient's hemoglobin was mostly between 11-12 up until February 2020 and since then there has been a slow decline. Patient's baseline creatinine is between 3-4. Most recent ferritin on 10/03/2019 was 31. TSH in January 2021 was normal at 3.9. B12 levels in January were 374.   Patient started taking oral iron since early March 2021  Results of blood work from 11/25/2019 were as follows: CBC showed a white cell count of 6.3, H&H of 9.8/28.6 and a normal platelet count.  Ferritin levels were low at 23.  Iron studies were normal.  Myeloma panel did not show any M protein.  Serum free light chains both kappa and lambda were elevated with a normal free light chain ratio 1.3.  B12 levels normal at 383 and folate was normal.   Interval history-patient continues to take oral iron and is tolerating it well without any side effects.  Reports mild fatigue but denies other complaints at this time  ECOG PS- 1 Pain scale- 0   Review of systems- Review of Systems  Constitutional: Negative for chills, fever, malaise/fatigue and weight loss.  HENT: Negative for congestion, ear discharge and nosebleeds.   Eyes: Negative for blurred vision.    Respiratory: Negative for cough, hemoptysis, sputum production, shortness of breath and wheezing.   Cardiovascular: Negative for chest pain, palpitations, orthopnea and claudication.  Gastrointestinal: Negative for abdominal pain, blood in stool, constipation, diarrhea, heartburn, melena, nausea and vomiting.  Genitourinary: Negative for dysuria, flank pain, frequency, hematuria and urgency.  Musculoskeletal: Negative for back pain, joint pain and myalgias.  Skin: Negative for rash.  Neurological: Negative for dizziness, tingling, focal weakness, seizures, weakness and headaches.  Endo/Heme/Allergies: Does not bruise/bleed easily.  Psychiatric/Behavioral: Negative for depression and suicidal ideas. The patient does not have insomnia.       Allergies  Allergen Reactions  . Isosorbide     Other reaction(s): Dizziness  . Other Anaphylaxis    Stinging insects-any  . Ace Inhibitors     Other reaction(s): Unknown Proteinuria  . Penicillin G Rash    Other reaction(s): Headache  . Penicillins Rash     Past Medical History:  Diagnosis Date  . CAD (coronary artery disease)   . Hypertension      Past Surgical History:  Procedure Laterality Date  . CORONARY ANGIOPLASTY WITH STENT PLACEMENT    . HERNIA REPAIR      Social History   Socioeconomic History  . Marital status: Married    Spouse name: Not on file  . Number of children: Not on file  . Years of education: Not on file  . Highest education level: Not on file  Occupational History  . Not on file  Tobacco Use  . Smoking status: Never Smoker  . Smokeless tobacco: Never Used  Substance and Sexual Activity  .  Alcohol use: Yes  . Drug use: Never  . Sexual activity: Not on file  Other Topics Concern  . Not on file  Social History Narrative  . Not on file   Social Determinants of Health   Financial Resource Strain:   . Difficulty of Paying Living Expenses:   Food Insecurity:   . Worried About Charity fundraiser  in the Last Year:   . Arboriculturist in the Last Year:   Transportation Needs:   . Film/video editor (Medical):   Marland Kitchen Lack of Transportation (Non-Medical):   Physical Activity:   . Days of Exercise per Week:   . Minutes of Exercise per Session:   Stress:   . Feeling of Stress :   Social Connections:   . Frequency of Communication with Friends and Family:   . Frequency of Social Gatherings with Friends and Family:   . Attends Religious Services:   . Active Member of Clubs or Organizations:   . Attends Archivist Meetings:   Marland Kitchen Marital Status:   Intimate Partner Violence:   . Fear of Current or Ex-Partner:   . Emotionally Abused:   Marland Kitchen Physically Abused:   . Sexually Abused:     Family History  Problem Relation Age of Onset  . CVA Mother   . CAD Mother      Current Outpatient Medications:  .  Acetaminophen 500 MG coapsule, Take 2 tablets by mouth as needed., Disp: , Rfl:  .  atorvastatin (LIPITOR) 40 MG tablet, Take 1 tablet by mouth daily., Disp: , Rfl:  .  ferrous sulfate 325 (65 FE) MG tablet, Take 325 mg by mouth daily. , Disp: , Rfl:  .  isosorbide mononitrate (IMDUR) 30 MG 24 hr tablet, Take 1 tablet by mouth daily., Disp: , Rfl:  .  metoprolol succinate (TOPROL-XL) 50 MG 24 hr tablet, Take 75 mg by mouth daily., Disp: , Rfl:  .  nitroGLYCERIN (NITROSTAT) 0.4 MG SL tablet, Place 1 tablet under the tongue., Disp: , Rfl:  .  sertraline (ZOLOFT) 50 MG tablet, Take 1 tablet by mouth daily., Disp: , Rfl:  .  zolpidem (AMBIEN) 10 MG tablet, Take 10 mg by mouth at bedtime as needed for sleep., Disp: , Rfl:   Physical exam:  Vitals:   01/28/20 1410  BP: (!) 146/74  Pulse: 88  Temp: 97.9 F (36.6 C)  TempSrc: Tympanic  Weight: 135 lb (61.2 kg)   Physical Exam Constitutional:      General: He is not in acute distress. Cardiovascular:     Rate and Rhythm: Normal rate and regular rhythm.     Heart sounds: Normal heart sounds.  Pulmonary:     Effort:  Pulmonary effort is normal.     Breath sounds: Normal breath sounds.  Abdominal:     General: Bowel sounds are normal.     Palpations: Abdomen is soft.  Skin:    General: Skin is warm and dry.  Neurological:     Mental Status: He is alert and oriented to person, place, and time.      CMP Latest Ref Rng & Units 03/24/2018  Glucose 70 - 99 mg/dL 98  BUN 8 - 23 mg/dL 45(H)  Creatinine 0.61 - 1.24 mg/dL 3.03(H)  Sodium 135 - 145 mmol/L 142  Potassium 3.5 - 5.1 mmol/L 3.8  Chloride 98 - 111 mmol/L 112(H)  CO2 22 - 32 mmol/L 21(L)  Calcium 8.9 - 10.3 mg/dL 8.5(L)  CBC Latest Ref Rng & Units 01/28/2020  WBC 4.0 - 10.5 K/uL 6.9  Hemoglobin 13.0 - 17.0 g/dL 10.1(L)  Hematocrit 39 - 52 % 27.9(L)  Platelets 150 - 400 K/uL 214    No images are attached to the encounter.  No results found.   Assessment and plan- Patient is a 86 y.o. male with anemia of chronic kidney disease and a component of iron deficiency here for routine follow-up  Patient's hemoglobin is mildly improved from 9.8-10.1.  Ferritin levels are 44.  Patient does have chronic kidney disease and his baseline creatinine is around 3.5.  In patients with chronic kidney disease he would like the ferritin levels to be higher and closer to 200 if possible.  Patient is still hesitant to go on IV iron.  He will continue to take oral iron and I will see him back in 3 months with CBC ferritin and iron studies and CMP   Visit Diagnosis 1. Iron deficiency anemia, unspecified iron deficiency anemia type      Dr. Randa Evens, MD, MPH [facility] [place] at [facility] [phone] 01/29/2020 2:06 PM

## 2020-04-30 ENCOUNTER — Other Ambulatory Visit: Payer: Self-pay

## 2020-04-30 ENCOUNTER — Inpatient Hospital Stay (HOSPITAL_BASED_OUTPATIENT_CLINIC_OR_DEPARTMENT_OTHER): Payer: Medicare Other | Admitting: Oncology

## 2020-04-30 ENCOUNTER — Encounter: Payer: Self-pay | Admitting: Oncology

## 2020-04-30 ENCOUNTER — Inpatient Hospital Stay: Payer: Medicare Other | Attending: Oncology

## 2020-04-30 VITALS — BP 144/78 | HR 87 | Temp 97.8°F | Resp 16 | Wt 129.6 lb

## 2020-04-30 DIAGNOSIS — N189 Chronic kidney disease, unspecified: Secondary | ICD-10-CM | POA: Diagnosis not present

## 2020-04-30 DIAGNOSIS — N183 Chronic kidney disease, stage 3 unspecified: Secondary | ICD-10-CM | POA: Insufficient documentation

## 2020-04-30 DIAGNOSIS — D509 Iron deficiency anemia, unspecified: Secondary | ICD-10-CM | POA: Diagnosis not present

## 2020-04-30 DIAGNOSIS — D631 Anemia in chronic kidney disease: Secondary | ICD-10-CM

## 2020-04-30 DIAGNOSIS — I129 Hypertensive chronic kidney disease with stage 1 through stage 4 chronic kidney disease, or unspecified chronic kidney disease: Secondary | ICD-10-CM | POA: Diagnosis not present

## 2020-04-30 DIAGNOSIS — E785 Hyperlipidemia, unspecified: Secondary | ICD-10-CM | POA: Diagnosis not present

## 2020-04-30 LAB — CBC WITH DIFFERENTIAL/PLATELET
Abs Immature Granulocytes: 0.05 10*3/uL (ref 0.00–0.07)
Basophils Absolute: 0.1 10*3/uL (ref 0.0–0.1)
Basophils Relative: 1 %
Eosinophils Absolute: 0.2 10*3/uL (ref 0.0–0.5)
Eosinophils Relative: 2 %
HCT: 28.9 % — ABNORMAL LOW (ref 39.0–52.0)
Hemoglobin: 10.2 g/dL — ABNORMAL LOW (ref 13.0–17.0)
Immature Granulocytes: 1 %
Lymphocytes Relative: 15 %
Lymphs Abs: 1.1 10*3/uL (ref 0.7–4.0)
MCH: 33.2 pg (ref 26.0–34.0)
MCHC: 35.3 g/dL (ref 30.0–36.0)
MCV: 94.1 fL (ref 80.0–100.0)
Monocytes Absolute: 0.7 10*3/uL (ref 0.1–1.0)
Monocytes Relative: 10 %
Neutro Abs: 5.3 10*3/uL (ref 1.7–7.7)
Neutrophils Relative %: 71 %
Platelets: 242 10*3/uL (ref 150–400)
RBC: 3.07 MIL/uL — ABNORMAL LOW (ref 4.22–5.81)
RDW: 13 % (ref 11.5–15.5)
WBC: 7.3 10*3/uL (ref 4.0–10.5)
nRBC: 0 % (ref 0.0–0.2)

## 2020-04-30 LAB — COMPREHENSIVE METABOLIC PANEL
ALT: 14 U/L (ref 0–44)
AST: 18 U/L (ref 15–41)
Albumin: 3.9 g/dL (ref 3.5–5.0)
Alkaline Phosphatase: 46 U/L (ref 38–126)
Anion gap: 11 (ref 5–15)
BUN: 55 mg/dL — ABNORMAL HIGH (ref 8–23)
CO2: 22 mmol/L (ref 22–32)
Calcium: 8.5 mg/dL — ABNORMAL LOW (ref 8.9–10.3)
Chloride: 103 mmol/L (ref 98–111)
Creatinine, Ser: 3.64 mg/dL — ABNORMAL HIGH (ref 0.61–1.24)
GFR calc non Af Amer: 14 mL/min — ABNORMAL LOW (ref >60)
Glucose, Bld: 97 mg/dL (ref 70–99)
Potassium: 4.6 mmol/L (ref 3.5–5.1)
Sodium: 136 mmol/L (ref 135–145)
Total Bilirubin: 0.6 mg/dL (ref 0.3–1.2)
Total Protein: 6.7 g/dL (ref 6.5–8.1)

## 2020-04-30 LAB — IRON AND TIBC
Iron: 115 ug/dL (ref 45–182)
Saturation Ratios: 37 % (ref 17.9–39.5)
TIBC: 314 ug/dL (ref 250–450)
UIBC: 199 ug/dL

## 2020-04-30 LAB — FERRITIN: Ferritin: 57 ng/mL (ref 24–336)

## 2020-04-30 NOTE — Progress Notes (Signed)
Hematology/Oncology Consult note [facility]  [phone] [phone]  Patient Care Team: Rusty Aus, MD as PCP - General (Internal Medicine)   Name of the patient: Terrie Grajales  [id number]  1932-09-30   Date of visit: 04/30/20  Diagnosis- anemia secondary to chronic kidney disease as well as component of iron deficiency  Chief complaint/ Reason for visit-routine follow-up of iron deficiency anemia  Heme/Onc history: Patient is a 86 year old male with a past medical history significant for stage III CKD hypertension hyperlipidemia and Barrett's esophagus among other medical problems. Patient's most recent CBC showed hemoglobin of 9.7. His white cell count and platelets have been normal. Prior to that patient's hemoglobin was mostly between 11-12 up until February 2020 and since then there has been a slow decline. Patient's baseline creatinine is between 3-4. Most recent ferritin on 10/03/2019 was 31. TSH in January 2021 was normal at 3.9. B12 levels in January were 374.Patient started taking oral iron since early March 2021  Results of blood work from 11/25/2019 were as follows: CBC showed a white cell count of 6.3, H&H of 9.8/28.6 and a normal platelet count. Ferritin levels were low at 23. Iron studies were normal. Myeloma panel did not show any M protein. Serum free light chains both kappa and lambda were elevated with a normal free light chain ratio 1.3. B12 levels normal at 383 and folate was normal.   Interval history-patient is doing well at this time.  Denies any blood loss in his stool or urine.  He is tolerating oral iron without any difficulty  ECOG PS- 1 Pain scale- 0   Review of systems- Review of Systems  Constitutional: Positive for malaise/fatigue. Negative for chills, fever and weight loss.  HENT: Negative for congestion, ear discharge and nosebleeds.   Eyes: Negative for blurred vision.  Respiratory:  Negative for cough, hemoptysis, sputum production, shortness of breath and wheezing.   Cardiovascular: Negative for chest pain, palpitations, orthopnea and claudication.  Gastrointestinal: Negative for abdominal pain, blood in stool, constipation, diarrhea, heartburn, melena, nausea and vomiting.  Genitourinary: Negative for dysuria, flank pain, frequency, hematuria and urgency.  Musculoskeletal: Negative for back pain, joint pain and myalgias.  Skin: Negative for rash.  Neurological: Negative for dizziness, tingling, focal weakness, seizures, weakness and headaches.  Endo/Heme/Allergies: Does not bruise/bleed easily.  Psychiatric/Behavioral: Negative for depression and suicidal ideas. The patient does not have insomnia.        Allergies  Allergen Reactions  . Isosorbide     Other reaction(s): Dizziness  . Other Anaphylaxis    Stinging insects-any  . Ace Inhibitors     Other reaction(s): Unknown Proteinuria  . Penicillin G Rash    Other reaction(s): Headache  . Penicillins Rash     Past Medical History:  Diagnosis Date  . CAD (coronary artery disease)   . Hypertension      Past Surgical History:  Procedure Laterality Date  . CORONARY ANGIOPLASTY WITH STENT PLACEMENT    . HERNIA REPAIR      Social History   Socioeconomic History  . Marital status: Married    Spouse name: Not on file  . Number of children: Not on file  . Years of education: Not on file  . Highest education level: Not on file  Occupational History  . Not on file  Tobacco Use  . Smoking status: Never Smoker  . Smokeless tobacco: Never Used  Substance and Sexual Activity  . Alcohol use: Yes  .  Drug use: Never  . Sexual activity: Not on file  Other Topics Concern  . Not on file  Social History Narrative  . Not on file   Social Determinants of Health   Financial Resource Strain:   . Difficulty of Paying Living Expenses: Not on file  Food Insecurity:   . Worried About Charity fundraiser in  the Last Year: Not on file  . Ran Out of Food in the Last Year: Not on file  Transportation Needs:   . Lack of Transportation (Medical): Not on file  . Lack of Transportation (Non-Medical): Not on file  Physical Activity:   . Days of Exercise per Week: Not on file  . Minutes of Exercise per Session: Not on file  Stress:   . Feeling of Stress : Not on file  Social Connections:   . Frequency of Communication with Friends and Family: Not on file  . Frequency of Social Gatherings with Friends and Family: Not on file  . Attends Religious Services: Not on file  . Active Member of Clubs or Organizations: Not on file  . Attends Archivist Meetings: Not on file  . Marital Status: Not on file  Intimate Partner Violence:   . Fear of Current or Ex-Partner: Not on file  . Emotionally Abused: Not on file  . Physically Abused: Not on file  . Sexually Abused: Not on file    Family History  Problem Relation Age of Onset  . CVA Mother   . CAD Mother      Current Outpatient Medications:  .  Acetaminophen 500 MG coapsule, Take 2 tablets by mouth as needed., Disp: , Rfl:  .  atorvastatin (LIPITOR) 40 MG tablet, Take 1 tablet by mouth daily., Disp: , Rfl:  .  cloNIDine (CATAPRES - DOSED IN MG/24 HR) 0.1 mg/24hr patch, Place onto the skin., Disp: , Rfl:  .  ferrous gluconate (FERGON) 324 MG tablet, Take 324 mg by mouth 3 (three) times a week., Disp: , Rfl:  .  ferrous sulfate 325 (65 FE) MG tablet, Take 325 mg by mouth daily. , Disp: , Rfl:  .  isosorbide mononitrate (IMDUR) 30 MG 24 hr tablet, Take 1 tablet by mouth daily., Disp: , Rfl:  .  metoprolol succinate (TOPROL-XL) 50 MG 24 hr tablet, Take 75 mg by mouth daily., Disp: , Rfl:  .  nitroGLYCERIN (NITROSTAT) 0.4 MG SL tablet, Place 1 tablet under the tongue., Disp: , Rfl:  .  sertraline (ZOLOFT) 50 MG tablet, Take 1 tablet by mouth daily., Disp: , Rfl:  .  zolpidem (AMBIEN) 10 MG tablet, Take 10 mg by mouth at bedtime as needed for  sleep., Disp: , Rfl:   Physical exam:  Vitals:   04/30/20 1044  BP: (!) 144/78  Pulse: 87  Resp: 16  Temp: 97.8 F (36.6 C)  TempSrc: Tympanic  SpO2: 100%  Weight: 129 lb 9.6 oz (58.8 kg)   Physical Exam Constitutional:      General: He is not in acute distress. Cardiovascular:     Rate and Rhythm: Normal rate and regular rhythm.     Heart sounds: Normal heart sounds.  Pulmonary:     Effort: Pulmonary effort is normal.     Breath sounds: Normal breath sounds.  Abdominal:     General: Bowel sounds are normal.     Palpations: Abdomen is soft.  Skin:    General: Skin is warm and dry.  Neurological:     Mental  Status: He is alert and oriented to person, place, and time.      CMP Latest Ref Rng & Units 04/30/2020  Glucose 70 - 99 mg/dL 97  BUN 8 - 23 mg/dL 55(H)  Creatinine 0.61 - 1.24 mg/dL 3.64(H)  Sodium 135 - 145 mmol/L 136  Potassium 3.5 - 5.1 mmol/L 4.6  Chloride 98 - 111 mmol/L 103  CO2 22 - 32 mmol/L 22  Calcium 8.9 - 10.3 mg/dL 8.5(L)  Total Protein 6.5 - 8.1 g/dL 6.7  Total Bilirubin 0.3 - 1.2 mg/dL 0.6  Alkaline Phos 38 - 126 U/L 46  AST 15 - 41 U/L 18  ALT 0 - 44 U/L 14   CBC Latest Ref Rng & Units 04/30/2020  WBC 4.0 - 10.5 K/uL 7.3  Hemoglobin 13.0 - 17.0 g/dL 10.2(L)  Hematocrit 39 - 52 % 28.9(L)  Platelets 150 - 400 K/uL 242      Assessment and plan- Patient is a 86 y.o. male with chronic normocytic anemia likely multifactorial secondary to anemia of chronic kidney disease as well as a component of iron deficiency  Patient's hemoglobin is currently stable at 10.2.  He is currently taking oral iron.  His ferritin levels are 57 and ideally in patients with CKD we would like to keep it closer to 200.  However with oral iron therapy his ferritin has improved gradually from 23-57.  Hemoglobin is close to 10 and therefore does not require EPO at this time.  I will repeat CBC CMP ferritin and iron studies in 6 months.  If his hemoglobin drifts down to 9 or  less with normal iron levels EPO would be indicated   Visit Diagnosis 1. Iron deficiency anemia, unspecified iron deficiency anemia type   2. Anemia of chronic kidney failure, unspecified stage      Dr. Randa Evens, MD, MPH [facility] [phone] 04/30/2020 4:11 PM

## 2020-06-04 ENCOUNTER — Inpatient Hospital Stay (HOSPITAL_COMMUNITY)
Admission: EM | Admit: 2020-06-04 | Discharge: 2020-06-05 | DRG: 086 | Disposition: A | Discharge status: Home / Self Care | Payer: Medicare Other | Attending: Neurological Surgery | Admitting: Neurological Surgery

## 2020-06-04 ENCOUNTER — Emergency Department (HOSPITAL_COMMUNITY): Payer: Medicare Other

## 2020-06-04 ENCOUNTER — Other Ambulatory Visit: Payer: Self-pay

## 2020-06-04 DIAGNOSIS — R296 Repeated falls: Secondary | ICD-10-CM | POA: Diagnosis present

## 2020-06-04 DIAGNOSIS — S065X9A Traumatic subdural hemorrhage with loss of consciousness of unspecified duration, initial encounter: Secondary | ICD-10-CM

## 2020-06-04 DIAGNOSIS — R569 Unspecified convulsions: Secondary | ICD-10-CM | POA: Diagnosis present

## 2020-06-04 DIAGNOSIS — Z888 Allergy status to other drugs, medicaments and biological substances status: Secondary | ICD-10-CM

## 2020-06-04 DIAGNOSIS — Z823 Family history of stroke: Secondary | ICD-10-CM

## 2020-06-04 DIAGNOSIS — Z20822 Contact with and (suspected) exposure to covid-19: Secondary | ICD-10-CM | POA: Diagnosis present

## 2020-06-04 DIAGNOSIS — I2510 Atherosclerotic heart disease of native coronary artery without angina pectoris: Secondary | ICD-10-CM | POA: Diagnosis present

## 2020-06-04 DIAGNOSIS — I120 Hypertensive chronic kidney disease with stage 5 chronic kidney disease or end stage renal disease: Secondary | ICD-10-CM | POA: Diagnosis present

## 2020-06-04 DIAGNOSIS — N185 Chronic kidney disease, stage 5: Secondary | ICD-10-CM | POA: Diagnosis present

## 2020-06-04 DIAGNOSIS — W1830XA Fall on same level, unspecified, initial encounter: Secondary | ICD-10-CM | POA: Diagnosis present

## 2020-06-04 DIAGNOSIS — S065X0A Traumatic subdural hemorrhage without loss of consciousness, initial encounter: Secondary | ICD-10-CM | POA: Diagnosis not present

## 2020-06-04 DIAGNOSIS — Z91038 Other insect allergy status: Secondary | ICD-10-CM

## 2020-06-04 DIAGNOSIS — Y92019 Unspecified place in single-family (private) house as the place of occurrence of the external cause: Secondary | ICD-10-CM

## 2020-06-04 DIAGNOSIS — Z880 Allergy status to penicillin: Secondary | ICD-10-CM

## 2020-06-04 DIAGNOSIS — Z955 Presence of coronary angioplasty implant and graft: Secondary | ICD-10-CM

## 2020-06-04 DIAGNOSIS — Z91030 Bee allergy status: Secondary | ICD-10-CM

## 2020-06-04 DIAGNOSIS — S065XAA Traumatic subdural hemorrhage with loss of consciousness status unknown, initial encounter: Secondary | ICD-10-CM | POA: Diagnosis present

## 2020-06-04 DIAGNOSIS — Z8249 Family history of ischemic heart disease and other diseases of the circulatory system: Secondary | ICD-10-CM

## 2020-06-04 LAB — COMPREHENSIVE METABOLIC PANEL
ALT: 14 U/L (ref 0–44)
AST: 29 U/L (ref 15–41)
Albumin: 3.6 g/dL (ref 3.5–5.0)
Alkaline Phosphatase: 50 U/L (ref 38–126)
Anion gap: 14 (ref 5–15)
BUN: 40 mg/dL — ABNORMAL HIGH (ref 8–23)
CO2: 18 mmol/L — ABNORMAL LOW (ref 22–32)
Calcium: 8.8 mg/dL — ABNORMAL LOW (ref 8.9–10.3)
Chloride: 100 mmol/L (ref 98–111)
Creatinine, Ser: 4.01 mg/dL — ABNORMAL HIGH (ref 0.61–1.24)
GFR, Estimated: 14 mL/min — ABNORMAL LOW (ref >60)
Glucose, Bld: 182 mg/dL — ABNORMAL HIGH (ref 70–99)
Potassium: 4.1 mmol/L (ref 3.5–5.1)
Sodium: 132 mmol/L — ABNORMAL LOW (ref 135–145)
Total Bilirubin: 0.7 mg/dL (ref 0.3–1.2)
Total Protein: 6.1 g/dL — ABNORMAL LOW (ref 6.5–8.1)

## 2020-06-04 LAB — CBC WITH DIFFERENTIAL/PLATELET
Abs Immature Granulocytes: 0.13 10*3/uL — ABNORMAL HIGH (ref 0.00–0.07)
Basophils Absolute: 0.1 10*3/uL (ref 0.0–0.1)
Basophils Relative: 0 %
Eosinophils Absolute: 0 10*3/uL (ref 0.0–0.5)
Eosinophils Relative: 0 %
HCT: 26.7 % — ABNORMAL LOW (ref 39.0–52.0)
Hemoglobin: 9.1 g/dL — ABNORMAL LOW (ref 13.0–17.0)
Immature Granulocytes: 1 %
Lymphocytes Relative: 3 %
Lymphs Abs: 0.5 10*3/uL — ABNORMAL LOW (ref 0.7–4.0)
MCH: 33.3 pg (ref 26.0–34.0)
MCHC: 34.1 g/dL (ref 30.0–36.0)
MCV: 97.8 fL (ref 80.0–100.0)
Monocytes Absolute: 1.1 10*3/uL — ABNORMAL HIGH (ref 0.1–1.0)
Monocytes Relative: 7 %
Neutro Abs: 14 10*3/uL — ABNORMAL HIGH (ref 1.7–7.7)
Neutrophils Relative %: 89 %
Platelets: 207 10*3/uL (ref 150–400)
RBC: 2.73 MIL/uL — ABNORMAL LOW (ref 4.22–5.81)
RDW: 12.9 % (ref 11.5–15.5)
WBC: 15.8 10*3/uL — ABNORMAL HIGH (ref 4.0–10.5)
nRBC: 0 % (ref 0.0–0.2)

## 2020-06-04 LAB — MAGNESIUM: Magnesium: 2.1 mg/dL (ref 1.7–2.4)

## 2020-06-04 LAB — CBG MONITORING, ED: Glucose-Capillary: 163 mg/dL — ABNORMAL HIGH (ref 70–99)

## 2020-06-04 MED ORDER — LEVETIRACETAM IN NACL 1000 MG/100ML IV SOLN
1000.0000 mg | Freq: Once | INTRAVENOUS | Status: AC
  Administered 2020-06-04: 1000 mg via INTRAVENOUS
  Filled 2020-06-04: qty 100

## 2020-06-04 NOTE — ED Provider Notes (Signed)
[facility] EMERGENCY DEPARTMENT Provider Note   CSN: [id number] Arrival date & time: 06/04/20  2212     History Chief Complaint  Patient presents with  . Seizures    Eddie Mullen is a 86 y.o. male.  HPI 86 year old male presents with seizure.  History is taken from EMS and patient.  Patient has no prior history of seizures.  Has been having dizzy spells for the last couple days and a headache a couple days ago that went away with Tylenol.  Tonight while eating dinner he all of a sudden and started drooling and then convulsing for about 30 seconds.  Was postictal until fire department arrived.  Now seems to be back to his normal self.  He feels fine though he did bite his tongue.  Right now he has no complaints. No recent illness.   Found to have some difficulty using left arm, states it's been weaker for about 1 week.   Past Medical History:  Diagnosis Date  . CAD (coronary artery disease)   . Hypertension     Patient Active Problem List   Diagnosis Date Noted  . Iron deficiency anemia 10/14/2019  . Chest pain 03/23/2018    Past Surgical History:  Procedure Laterality Date  . CORONARY ANGIOPLASTY WITH STENT PLACEMENT    . HERNIA REPAIR         Family History  Problem Relation Age of Onset  . CVA Mother   . CAD Mother     Social History   Tobacco Use  . Smoking status: Never Smoker  . Smokeless tobacco: Never Used  Substance Use Topics  . Alcohol use: Yes  . Drug use: Never    Home Medications Prior to Admission medications   Medication Sig Start Date End Date Taking? Authorizing Provider  Acetaminophen 500 MG coapsule Take 2 tablets by mouth as needed.    [provider]  atorvastatin (LIPITOR) 40 MG tablet Take 1 tablet by mouth daily. 01/03/18   [provider]  cloNIDine (CATAPRES - DOSED IN MG/24 HR) 0.1 mg/24hr patch Place onto the skin. 03/20/20 03/20/21  [provider]  ferrous gluconate (FERGON)  324 MG tablet Take 324 mg by mouth 3 (three) times a week. 03/02/20   [provider]  ferrous sulfate 325 (65 FE) MG tablet Take 325 mg by mouth daily.     [provider]  isosorbide mononitrate (IMDUR) 30 MG 24 hr tablet Take 1 tablet by mouth daily. 03/21/18   [provider]  metoprolol succinate (TOPROL-XL) 50 MG 24 hr tablet Take 75 mg by mouth daily. 03/07/18   [provider]  nitroGLYCERIN (NITROSTAT) 0.4 MG SL tablet Place 1 tablet under the tongue.    [provider]  sertraline (ZOLOFT) 50 MG tablet Take 1 tablet by mouth daily. 07/30/19 07/29/20  [provider]  zolpidem (AMBIEN) 10 MG tablet Take 10 mg by mouth at bedtime as needed for sleep.    [provider]    Allergies    Isosorbide, Other, Ace inhibitors, Penicillin g, and Penicillins  Review of Systems   Review of Systems  Constitutional: Negative for fever.  Gastrointestinal: Negative for diarrhea and vomiting.  Neurological: Positive for dizziness, weakness and headaches.  All other systems reviewed and are negative.   Physical Exam Updated Vital Signs BP (!) 176/87   Pulse 85   Temp 98 F (36.7 C) (Oral)   Resp 19   Ht 5\' 7"  (1.702 m)  Wt 59 kg   SpO2 100%   BMI 20.36 kg/m   Physical Exam Vitals and nursing note reviewed.  Constitutional:      General: He is not in acute distress.    Appearance: He is well-developed. He is not ill-appearing or diaphoretic.  HENT:     Head: Normocephalic.     Right Ear: External ear normal.     Left Ear: External ear normal.     Nose: Nose normal.     Mouth/Throat:     Comments: Mild abrasion to left tongue Eyes:     General:        Right eye: No discharge.        Left eye: No discharge.     Extraocular Movements: Extraocular movements intact.     Pupils: Pupils are equal, round, and reactive to light.  Cardiovascular:     Rate and Rhythm: Regular rhythm. Tachycardia present.     Heart sounds: Normal  heart sounds.     Comments: HR~100 Pulmonary:     Effort: Pulmonary effort is normal.     Breath sounds: Normal breath sounds.  Abdominal:     Palpations: Abdomen is soft.     Tenderness: There is no abdominal tenderness.  Musculoskeletal:     Cervical back: Neck supple.  Skin:    General: Skin is warm and dry.  Neurological:     Mental Status: He is alert.     Comments: CN 3-12 grossly intact. 5/5 strength in all 4 extremities save for some weakness in the proximal left arm. Grossly normal sensation. Normal finger to nose.   Psychiatric:        Mood and Affect: Mood is not anxious.     ED Results / Procedures / Treatments   Labs (all labs ordered are listed, but only abnormal results are displayed) Labs Reviewed  CBC WITH DIFFERENTIAL/PLATELET - Abnormal; Notable for the following components:      Result Value   WBC 15.8 (*)    RBC 2.73 (*)    Hemoglobin 9.1 (*)    HCT 26.7 (*)    Neutro Abs 14.0 (*)    Lymphs Abs 0.5 (*)    Monocytes Absolute 1.1 (*)    Abs Immature Granulocytes 0.13 (*)    All other components within normal limits  CBG MONITORING, ED - Abnormal; Notable for the following components:   Glucose-Capillary 163 (*)    All other components within normal limits  RESPIRATORY PANEL BY RT PCR (FLU A&B, COVID)  COMPREHENSIVE METABOLIC PANEL  MAGNESIUM    EKG EKG Interpretation  Date/Time:  Thursday June 04 2020 22:25:31 EST Ventricular Rate:  97 PR Interval:    QRS Duration: 92 QT Interval:  307 QTC Calculation: 390 R Axis:   -3 Text Interpretation: Sinus rhythm Repol abnrm, severe global ischemia (LM/MVD) ST changes similar to 2019 Confirmed by Sherwood Gambler [phone]) on 06/04/2020 10:27:09 PM   Radiology No results found.  Procedures .Critical Care Performed by: Sherwood Gambler, MD Authorized by: Sherwood Gambler, MD   Critical care provider statement:    Critical care time (minutes):  40   Critical care time was exclusive of:   Separately billable procedures and treating other patients   Critical care was necessary to treat or prevent imminent or life-threatening deterioration of the following conditions:  CNS failure or compromise   Critical care was time spent personally by me on the following activities:  Discussions with consultants, evaluation of patient's response to  treatment, examination of patient, ordering and performing treatments and interventions, ordering and review of laboratory studies, ordering and review of radiographic studies, pulse oximetry, re-evaluation of patient's condition, obtaining history from patient or surrogate and review of old charts   (including critical care time)  Medications Ordered in ED Medications  levETIRAcetam (KEPPRA) IVPB 1000 mg/100 mL premix (has no administration in time range)    ED Course  I have reviewed the triage vital signs and the nursing notes.  Pertinent labs & imaging results that were available during my care of the patient were reviewed by me and considered in my medical decision making (see chart for details).    MDM Rules/Calculators/A&P                          Patient CT scan shows right-sided subdural hematoma.  Patient does note he fell a couple days ago after he got dizzy but does not think he hit his head.  At this point he does have some left upper extremity proximal weakness.  I discussed case with Dr. Reatha Armour, who will admit.  We will give IV Keppra 1000 mg.  Otherwise, patient is GCS 15.  I tried to call his wife to update but she did not answer. Admit. Final Clinical Impression(s) / ED Diagnoses Final diagnoses:  Subdural hematoma [facility])    Rx / DC Orders ED Discharge Orders    None       Sherwood Gambler, MD 06/04/20 2313

## 2020-06-04 NOTE — ED Notes (Signed)
Patient transported to CT 

## 2020-06-04 NOTE — ED Triage Notes (Signed)
Brought in by [facility] from home, wife noticed pt drooling and had convulsions.  responsive to pain initially with fire EMS. Pt has been having dizzy spells and headaches at home. No hx of seizures. No incontinence noted.

## 2020-06-05 ENCOUNTER — Inpatient Hospital Stay (HOSPITAL_COMMUNITY): Payer: Medicare Other

## 2020-06-05 DIAGNOSIS — Z8249 Family history of ischemic heart disease and other diseases of the circulatory system: Secondary | ICD-10-CM | POA: Diagnosis not present

## 2020-06-05 DIAGNOSIS — Y92019 Unspecified place in single-family (private) house as the place of occurrence of the external cause: Secondary | ICD-10-CM | POA: Diagnosis not present

## 2020-06-05 DIAGNOSIS — W1830XA Fall on same level, unspecified, initial encounter: Secondary | ICD-10-CM | POA: Diagnosis present

## 2020-06-05 DIAGNOSIS — N185 Chronic kidney disease, stage 5: Secondary | ICD-10-CM | POA: Diagnosis present

## 2020-06-05 DIAGNOSIS — R296 Repeated falls: Secondary | ICD-10-CM | POA: Diagnosis present

## 2020-06-05 DIAGNOSIS — Z20822 Contact with and (suspected) exposure to covid-19: Secondary | ICD-10-CM | POA: Diagnosis present

## 2020-06-05 DIAGNOSIS — Z91038 Other insect allergy status: Secondary | ICD-10-CM | POA: Diagnosis not present

## 2020-06-05 DIAGNOSIS — R569 Unspecified convulsions: Secondary | ICD-10-CM | POA: Diagnosis present

## 2020-06-05 DIAGNOSIS — S065X9A Traumatic subdural hemorrhage with loss of consciousness of unspecified duration, initial encounter: Secondary | ICD-10-CM | POA: Diagnosis present

## 2020-06-05 DIAGNOSIS — Z880 Allergy status to penicillin: Secondary | ICD-10-CM | POA: Diagnosis not present

## 2020-06-05 DIAGNOSIS — Z91030 Bee allergy status: Secondary | ICD-10-CM | POA: Diagnosis not present

## 2020-06-05 DIAGNOSIS — Z888 Allergy status to other drugs, medicaments and biological substances status: Secondary | ICD-10-CM | POA: Diagnosis not present

## 2020-06-05 DIAGNOSIS — Z823 Family history of stroke: Secondary | ICD-10-CM | POA: Diagnosis not present

## 2020-06-05 DIAGNOSIS — I120 Hypertensive chronic kidney disease with stage 5 chronic kidney disease or end stage renal disease: Secondary | ICD-10-CM | POA: Diagnosis present

## 2020-06-05 DIAGNOSIS — S065XAA Traumatic subdural hemorrhage with loss of consciousness status unknown, initial encounter: Secondary | ICD-10-CM | POA: Diagnosis present

## 2020-06-05 DIAGNOSIS — I2510 Atherosclerotic heart disease of native coronary artery without angina pectoris: Secondary | ICD-10-CM | POA: Diagnosis present

## 2020-06-05 DIAGNOSIS — S065X0A Traumatic subdural hemorrhage without loss of consciousness, initial encounter: Secondary | ICD-10-CM | POA: Diagnosis present

## 2020-06-05 DIAGNOSIS — Z955 Presence of coronary angioplasty implant and graft: Secondary | ICD-10-CM | POA: Diagnosis not present

## 2020-06-05 LAB — CBC
HCT: 26.7 % — ABNORMAL LOW (ref 39.0–52.0)
Hemoglobin: 9 g/dL — ABNORMAL LOW (ref 13.0–17.0)
MCH: 33.7 pg (ref 26.0–34.0)
MCHC: 33.7 g/dL (ref 30.0–36.0)
MCV: 100 fL (ref 80.0–100.0)
Platelets: 215 10*3/uL (ref 150–400)
RBC: 2.67 MIL/uL — ABNORMAL LOW (ref 4.22–5.81)
RDW: 13 % (ref 11.5–15.5)
WBC: 14 10*3/uL — ABNORMAL HIGH (ref 4.0–10.5)
nRBC: 0 % (ref 0.0–0.2)

## 2020-06-05 LAB — BASIC METABOLIC PANEL
Anion gap: 14 (ref 5–15)
BUN: 41 mg/dL — ABNORMAL HIGH (ref 8–23)
CO2: 19 mmol/L — ABNORMAL LOW (ref 22–32)
Calcium: 8.8 mg/dL — ABNORMAL LOW (ref 8.9–10.3)
Chloride: 101 mmol/L (ref 98–111)
Creatinine, Ser: 3.89 mg/dL — ABNORMAL HIGH (ref 0.61–1.24)
GFR, Estimated: 14 mL/min — ABNORMAL LOW (ref >60)
Glucose, Bld: 143 mg/dL — ABNORMAL HIGH (ref 70–99)
Potassium: 3.8 mmol/L (ref 3.5–5.1)
Sodium: 134 mmol/L — ABNORMAL LOW (ref 135–145)

## 2020-06-05 LAB — APTT: aPTT: 23 seconds — ABNORMAL LOW (ref 24–36)

## 2020-06-05 LAB — MRSA PCR SCREENING: MRSA by PCR: NEGATIVE

## 2020-06-05 LAB — RESPIRATORY PANEL BY RT PCR (FLU A&B, COVID)
Influenza A by PCR: NEGATIVE
Influenza B by PCR: NEGATIVE
SARS Coronavirus 2 by RT PCR: NEGATIVE

## 2020-06-05 LAB — PROTIME-INR
INR: 1 (ref 0.8–1.2)
Prothrombin Time: 12.7 seconds (ref 11.4–15.2)

## 2020-06-05 MED ORDER — FERROUS GLUCONATE 324 (38 FE) MG PO TABS
324.0000 mg | ORAL_TABLET | ORAL | Status: DC
  Administered 2020-06-05: 324 mg via ORAL
  Filled 2020-06-05: qty 1

## 2020-06-05 MED ORDER — LABETALOL HCL 5 MG/ML IV SOLN
10.0000 mg | INTRAVENOUS | Status: DC | PRN
  Administered 2020-06-05 (×2): 10 mg via INTRAVENOUS
  Filled 2020-06-05 (×2): qty 4

## 2020-06-05 MED ORDER — DOCUSATE SODIUM 100 MG PO CAPS: 100.0000 mg | ORAL_CAPSULE | Freq: Two times a day (BID) | ORAL | Status: DC | PRN

## 2020-06-05 MED ORDER — ISOSORBIDE MONONITRATE ER 30 MG PO TB24
30.0000 mg | ORAL_TABLET | Freq: Every day | ORAL | Status: DC
  Administered 2020-06-05: 30 mg via ORAL
  Filled 2020-06-05: qty 1

## 2020-06-05 MED ORDER — LEVETIRACETAM 500 MG PO TABS
500.0000 mg | ORAL_TABLET | Freq: Two times a day (BID) | ORAL | Status: DC
  Administered 2020-06-05 (×3): 500 mg via ORAL
  Filled 2020-06-05 (×3): qty 1

## 2020-06-05 MED ORDER — SODIUM CHLORIDE 0.9 % IV SOLN: INTRAVENOUS | Status: DC

## 2020-06-05 MED ORDER — ATORVASTATIN CALCIUM 40 MG PO TABS
40.0000 mg | ORAL_TABLET | Freq: Every day | ORAL | Status: DC
  Administered 2020-06-05: 40 mg via ORAL
  Filled 2020-06-05: qty 1

## 2020-06-05 MED ORDER — CHLORHEXIDINE GLUCONATE CLOTH 2 % EX PADS
6.0000 | MEDICATED_PAD | Freq: Every day | CUTANEOUS | Status: DC
  Administered 2020-06-05: 6 via TOPICAL

## 2020-06-05 MED ORDER — POLYETHYLENE GLYCOL 3350 17 G PO PACK: 17.0000 g | PACK | Freq: Every day | ORAL | Status: DC | PRN

## 2020-06-05 MED ORDER — TRAMADOL HCL 50 MG PO TABS
50.0000 mg | ORAL_TABLET | Freq: Four times a day (QID) | ORAL | Status: DC | PRN
  Administered 2020-06-05: 50 mg via ORAL
  Filled 2020-06-05: qty 1

## 2020-06-05 MED ORDER — METOPROLOL SUCCINATE ER 50 MG PO TB24
75.0000 mg | ORAL_TABLET | Freq: Every day | ORAL | Status: DC
  Administered 2020-06-05: 75 mg via ORAL
  Filled 2020-06-05: qty 1

## 2020-06-05 MED ORDER — ACETAMINOPHEN 325 MG PO TABS
650.0000 mg | ORAL_TABLET | ORAL | Status: DC | PRN
  Administered 2020-06-05 (×2): 650 mg via ORAL
  Filled 2020-06-05 (×2): qty 2

## 2020-06-05 MED ORDER — LEVETIRACETAM 500 MG PO TABS
500.0000 mg | ORAL_TABLET | Freq: Two times a day (BID) | ORAL | 2 refills | Status: DC
Start: 2020-06-05 — End: 2020-08-03

## 2020-06-05 MED ORDER — ONDANSETRON HCL 4 MG/2ML IJ SOLN: 4.0000 mg | Freq: Four times a day (QID) | INTRAMUSCULAR | Status: DC | PRN

## 2020-06-05 NOTE — Discharge Summary (Signed)
  Physician Discharge Summary  Patient ID: Eddie Mullen MRN: [id number] DOB/AGE: Sep 21, 1932 86 y.o.  Admit date: 06/04/2020 Discharge date: 06/05/2020  Admission Diagnoses:  1.  Right acute on chronic subdural hematoma 2.  Seizures  Discharge Diagnoses:  Same Active Problems:   SDH (subdural hematoma) [facility])   Discharged Condition: Stable  Hospital Course:  Eddie Mullen is a 86 y.o. male that presented with a right-sided acute on chronic subdural and a seizure-like episode.  He was placed in the neuro ICU and monitored closely.  Repeat imaging revealed stable subdural hematoma.  There was minimal mass-effect only local sulcal mass-effect.  He remained neurologically intact throughout the hospitalization.  He was placed on antiepileptics.  Physical therapy and Occupational Therapy evaluated him and agreed that he was stable to go home.  He will follow up with repeat CT in approximately 2 weeks.  He was instructed on significant findings concerning for further seizures, worsening headaches or progression of his subdural hematoma.  Treatments: Observation, supportive management  Discharge Exam: Blood pressure (!) 145/77, pulse (!) 57, temperature 98.1 F (36.7 C), temperature source Oral, resp. rate 18, height 5\' 7"  (1.702 m), weight 58.4 kg, SpO2 100 %. Awake, alert, oriented Speech fluent, appropriate CN grossly intact 5/5 BUE/BLE except left upper extremity chronic deltoid weakness  Disposition: Discharge disposition: 01-Home or Self Care        Allergies as of 06/05/2020      Reactions   Isosorbide    Other reaction(s): Dizziness   Other Anaphylaxis   Stinging insects-any   Ace Inhibitors    Other reaction(s): Unknown Proteinuria   Penicillin G Rash   Other reaction(s): Headache   Penicillins Rash      Medication List    STOP taking these medications   Acetaminophen 500 MG capsule     TAKE these medications   atorvastatin 40 MG tablet Commonly  known as: LIPITOR Take 1 tablet by mouth daily.   cloNIDine 0.1 mg/24hr patch Commonly known as: CATAPRES - Dosed in mg/24 hr Place onto the skin.   ferrous gluconate 324 MG tablet Commonly known as: FERGON Take 324 mg by mouth 3 (three) times a week.   isosorbide mononitrate 30 MG 24 hr tablet Commonly known as: IMDUR Take 1 tablet by mouth daily.   levETIRAcetam 500 MG tablet Commonly known as: KEPPRA Take 1 tablet (500 mg total) by mouth 2 (two) times daily.   metoprolol succinate 50 MG 24 hr tablet Commonly known as: TOPROL-XL Take 75 mg by mouth daily.   nitroGLYCERIN 0.4 MG SL tablet Commonly known as: NITROSTAT Place 1 tablet under the tongue.   sertraline 50 MG tablet Commonly known as: ZOLOFT Take 1 tablet by mouth daily.   zolpidem 10 MG tablet Commonly known as: AMBIEN Take 10 mg by mouth at bedtime as needed for sleep. Take 1/2 tablet or whole tablet at bedtime       Follow-up Information    Naviyah Schaffert C, DO Follow up in 2 week(s).   Why: call for appointment, get CT brain beforehand Contact information: [address] [place] [address] [phone]               Signed: Theodoro Doing Verlyn Lambert 06/05/2020, 7:04 PM

## 2020-06-05 NOTE — Discharge Instructions (Signed)
Subdural Hematoma  A subdural hematoma is a collection of blood between the brain and its outer covering (dura). As the amount of blood increases, pressure builds on the brain. There are two types of subdural hematomas:  Acute. This type develops shortly after a hard, direct hit to the head and causes blood to collect very quickly. This is a medical emergency. If it is not diagnosed and treated quickly, it can lead to severe brain injury or death.  Chronic. This is when bleeding develops more slowly, over weeks or months. In some cases, this type does not cause symptoms. What are the causes? This condition is caused by bleeding (hemorrhage) from a broken (ruptured) blood vessel. In most cases, a blood vessel ruptures and bleeds because of a head injury, such as from a hard, direct hit. Head injuries can happen in car accidents, falls, assaults, or while playing sports. In rare cases, a hemorrhage can happen without a known cause (spontaneously), especially if you take blood thinners (anticoagulants). What increases the risk? This condition is more likely to develop in:  Older people.  Infants.  People who take blood thinners.  People who have head injuries.  People who abuse alcohol. What are the signs or symptoms? Symptoms of this condition can vary depending on the size of the hematoma. Symptoms can be mild, severe, or life-threatening. They include:  Headaches.  Nausea or vomiting.  Changes in vision, such as double vision or loss of vision.  Changes in speech or trouble understanding what people say.  Loss of balance or trouble walking.  Weakness, numbness, or tingling in the arms or legs, especially on one side of the body.  Seizures.  Change in personality.  Increased sleepiness.  Memory loss.  Loss of consciousness.  Coma. Symptoms of acute subdural hematoma can develop over minutes or hours. Symptoms of chronic subdural hematoma may develop over weeks or  months. How is this diagnosed? This condition is diagnosed based on the results of:  A physical exam.  Tests of strength, reflexes, coordination, senses, manner of walking (gait), and facial and eye movements (neurological exam).  Imaging tests, such as an MRI or a CT scan. How is this treated? Treatment for this condition depends on the type of hematoma and how severe it is. Treatment for acute hematoma may include:  Emergency surgery to drain blood or remove a blood clot.  Medicines that help the body get rid of excess fluids (diuretics). These may help to reduce pressure in the brain.  Assisted breathing (ventilation). Treatment for chronic hematoma may include:  Observation and bed rest at the hospital.  Surgery. If you take blood thinners, you may need to stop taking them for a short time. You may also be given anti-seizure (anticonvulsant) medicine. Sometimes, no treatment is needed for chronic subdural hematoma. Follow these instructions at home: Activity  Avoid situations where you could injure your head again, such as in competitive sports, downhill snow sports, and horseback riding. Do not do these activities until your health care provider approves. ? Wear protective gear, such as a helmet, when participating in activities such as biking or contact sports.  Avoid too much visual stimulation while recovering. This means limiting how much you read and limiting your screen time on a smart phone, tablet, computer, or TV.  Rest as told by your health care provider. Rest helps the brain heal.  Try to avoid activities that cause physical or mental stress. Return to work or school as told by  your health care provider.  Do not lift anything that is heavier than 5 lb (2.3 kg), or the limit you are told, until your health care provider says that it is safe.  Do not drive, ride a bike, or use heavy machinery until your health care provider approves.  Always wear your seat belt  when you are in a motor vehicle. Alcohol use  Do not drink alcohol if your health care provider tells you not to drink.  If you drink alcohol, limit how much you use to: ? 0-1 drink a day for women. ? 0-2 drinks a day for men. General instructions  Monitor your symptoms, and ask people around you to do the same. Recovery from brain injuries varies. Talk with your health care provider about what to expect.  Take over-the-counter and prescription medicines only as told by your health care provider. Do not take blood thinners or NSAIDs unless your health care provider approves. These include aspirin, ibuprofen, naproxen, and warfarin.  Keep your home environment safe to reduce the risk of falling.  Keep all follow-up visits as told by your health care provider. This is important. Where to find more information  [employer] of Neurological Disorders and Stroke: MasterBoxes.it  American Academy of Neurology (AAN): http://keith.biz/  Brain Injury Association of [place]: www.biausa.org Get help right away if you:  Are taking blood thinners and you fall or you experience minor trauma to the head. If you take any blood thinners, even a very small injury can cause a subdural hematoma.  Have a bleeding disorder and you fall or you experience minor trauma to the head.  Develop any of the following symptoms after a head injury: ? Clear fluid draining from your nose or ears. ? Nausea or vomiting. ? Changes in speech or trouble understanding what people say. ? Seizures. ? Drowsiness or a decrease in alertness. ? Double vision. ? Numbness or inability to move (paralysis) in any part of your body. ? Difficulty walking or poor coordination. ? Difficulty thinking. ? Confusion or forgetfulness. ? Personality changes. ? Irrational or aggressive behavior. These symptoms may represent a serious problem that is an emergency. Do not wait to see if the symptoms will go away. Get medical help  right away. Call your local emergency services (911 in the [place] Do not drive yourself to the hospital. Summary  A subdural hematoma is a collection of blood between the brain and its outer covering (dura).  Treatment for this condition depends on what type of subdural hematoma you have and how severe it is.  Symptoms can vary from mild to severe to life-threatening.  Monitor your symptoms, and ask others around you to do the same. This information is not intended to replace advice given to you by your health care provider. Make sure you discuss any questions you have with your health care provider. Document Revised: 06/11/2018 Document Reviewed: 06/11/2018 Elsevier Patient Education  [place] Injury, Adult There are many types of head injuries. They can be as minor as a bump. Some head injuries can be worse. Worse injuries include:  A strong hit to the head that shakes the brain back and forth causing damage (concussion).  A bruise (contusion) of the brain. This means there is bleeding in the brain that can cause swelling.  A cracked skull (skull fracture).  Bleeding in the brain that gathers, gets thick (makes a clot), and forms a bump (hematoma). Most problems from a head injury come  in the first 24 hours. However, you may still have side effects up to 7-10 days after your injury. It is important to watch your condition for any changes. You may need to be watched in the emergency department or urgent care, or you may need to stay in the hospital. What are the causes? There are many possible causes of a head injury. A serious head injury may be caused by:  A car accident.  Bicycle or motorcycle accidents.  Sports injuries.  Falls. What are the signs or symptoms? Symptoms of a head injury include a bruise, bump, or bleeding where the injury happened. Other physical symptoms may include:  Headache.  Feeling sick to your stomach (nauseous) or  vomiting.  Dizziness.  Feeling tired.  Being uncomfortable around bright lights or loud noises.  Shaking movements that you cannot control (seizures).  Trouble being woken up.  Passing out (fainting). Mental or emotional symptoms may include:  Feeling grumpy or cranky.  Confusion and memory problems.  Having trouble paying attention or concentrating.  Changes in eating or sleeping habits.  Feeling worried or nervous (anxious).  Feeling sad (depressed). How is this treated? Treatment for this condition depends on how severe the injury is and the type of injury you have. The main goal is to prevent complications and to allow the brain time to heal. Mild head injury If you have a mild head injury, you may be sent home and treatment may include:  Being watched. A responsible adult should stay with you for 24 hours after your injury and check on you often.  Physical rest.  Brain rest.  Pain medicines. Severe head injury If you have a severe head injury, treatment may include:  Being watched closely. This includes hospitalization with frequent physical exams.  Medicines to: ? Help with pain. ? Prevent shaking movements that you cannot control. ? Help with brain swelling.  Using a machine that helps you breathe (ventilator).  Treatments to manage the swelling inside the brain.  Brain surgery. This may be needed to: ? Remove a blood clot. ? Stop the bleeding. ? Remove a part of the skull. This allows room for the brain to swell. Follow these instructions at home: Activity  Rest.  Avoid activities that are hard or tiring.  Make sure you get enough sleep.  Limit activities that need a lot of thought or attention, such as: ? Watching TV. ? Playing memory games and puzzles. ? Job-related work or homework. ? Working on [employer], and texting.  Avoid activities that could cause another head injury until your doctor says it is okay. This  includes playing sports. Having another head injury, especially before the first one has healed, can be dangerous.  Ask your doctor when it is safe for you to go back to your normal activities, such as work or school. Ask your doctor for a step-by-step plan for slowly going back to your normal activities.  Ask your doctor when you can drive, ride a bicycle, or use heavy machinery. Do not do these activities if you are dizzy. Lifestyle   Do not drink alcohol until your doctor says it is okay.  Do not use drugs.  If it is harder than usual to remember things, write them down.  If you are easily distracted, try to do one thing at a time.  Talk with family members or close friends when making important decisions.  Tell your friends, family, a trusted coworker, and work Fre[employer] about  your injury, symptoms, and limits (restrictions). Have them watch for any problems that are new or getting worse. General instructions  Take over-the-counter and prescription medicines only as told by your doctor.  Have someone stay with you for 24 hours after your head injury. This person should watch you for any changes in your symptoms and be ready to get help.  Keep all follow-up visits as told by your doctor. This is important. How is this prevented?  Work on [employer]. This can help you avoid falls.  Wear a seatbelt when you are in a moving vehicle.  Wear a helmet when you: ? Ride a bicycle. ? Ski. ? Do any other sport or activity that has a risk of injury.  If you drink alcohol: ? Limit how much you use to:  0-1 drink a day for women.  0-2 drinks a day for men. ? Be aware of how much alcohol is in your drink. In the [place] one drink equals one 12 oz bottle of beer (355 mL), one 5 oz glass of wine (148 mL), or one 1 oz glass of hard liquor (44 mL).  Make your home safer by: ? Getting rid of clutter from the floors and stairs. This includes things that can make you  trip. ? Using grab bars in bathrooms and handrails by stairs. ? Placing non-slip mats on floors and in bathtubs. ? Putting more light in dim areas. Get help right away if:  You have: ? A very bad headache that is not helped by medicine. ? Trouble walking or weakness in your arms and legs. ? Clear or bloody fluid coming from your nose or ears. ? Changes in how you see (vision). ? Shaking movements that you cannot control.  You lose your balance.  You vomit.  The black centers of your eyes (pupils) change in size.  Your speech is slurred.  Your dizziness gets worse.  You pass out.  You are sleepier than normal and have trouble staying awake.  Your symptoms get worse. These symptoms may be an emergency. Do not wait to see if the symptoms will go away. Get medical help right away. Call your local emergency services (911 in the [place] Do not drive yourself to the hospital. Summary  There are many types of head injuries. They can be as minor as a bump. Some head injuries can be worse  Treatment for this condition depends on how severe the injury is and the type of injury you have.  Ask your doctor when it is safe for you to go back to your normal activities, such as work or school.  To prevent a head injury, wear a seat belt in a car, wear a helmet when you use a a bicycle, limit your alcohol use, and make your home safer. This information is not intended to replace advice given to you by your health care provider. Make sure you discuss any questions you have with your health care provider. Document Revised: 11/01/2018 Document Reviewed: 08/03/2018 Elsevier Patient Education  [place].

## 2020-06-05 NOTE — Progress Notes (Signed)
   Providing Compassionate, Quality Care - Together  NEUROSURGERY PROGRESS NOTE   S: No issues overnight. No sz activity, doing well  O: EXAM:  BP (!) 155/85   Pulse 63   Temp 98.6 F (37 C) (Oral)   Resp 17   Ht 5\' 7"  (1.702 m)   Wt 58.4 kg   SpO2 100%   BMI 20.16 kg/m   Awake, alert, oriented  Speech fluent, appropriate  CNs grossly intact  No drift LUE chronic delt weakness Otherwise symmetric strength throughout  ASSESSMENT:  86 y.o. male with  1.  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  Falls  PLAN: -CT brain this a.m. stable -PT/OT eval -Diet -Pain control -SBP control -Keppra for seizure prophylaxis -DC planning -Every 4 neurochecks -Stepdown to floor status    Thank you for allowing me to participate in this patient's care.  Please do not hesitate to call with questions or concerns.   Elwin Sleight, [facility] Cell: [phone]

## 2020-06-05 NOTE — Evaluation (Signed)
Physical Therapy Evaluation Patient Details Name: Eddie Mullen MRN: [id number] DOB: [birth year] Today's Date: 06/05/2020   History of Present Illness  This 86 y.o. male admitted with seizure like episode.   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  repeat CT of head shows stable SDH.  PMH includes:  h/o falls, HTN, CAD, s/p coronary angioplasty with stent placement   Clinical Impression  Pt admitted with/for seizure-like activity and found to have R SDH.  Pt is mobilizing at a min guard level with mild instability and appears to have mild slowness of processing, but should be safe at home with wife's available assist.  Will sign off at this time.     Follow Up Recommendations No PT follow up;Supervision - Intermittent    Equipment Recommendations  None recommended by PT    Recommendations for Other Services       Precautions / Restrictions Precautions Precautions: Fall Precaution Comments: history of falls.  Pt reports he has been getting dizzy when getting up at night, and then had an episode before the seizure in which "everything went black"       Mobility  Bed Mobility                    Transfers Overall transfer level: Needs assistance Equipment used: None Transfers: Sit to/from Stand Sit to Stand: Min guard         General transfer comment: assist to steady, pt had to step back to gain support from the chair against his legs  Ambulation/Gait Ambulation/Gait assistance: Min guard;Supervision Gait Distance (Feet): 300 Feet (then 150) Assistive device: None;IV Pole Gait Pattern/deviations: Step-through pattern   Gait velocity interpretation: 1.31 - 2.62 ft/sec, indicative of limited community ambulator General Gait Details: mildly unsteady without need for assist.  pt was slow to follow directional cues, stopped walking on occasion, appearing unsure what to do.  Pt was slow with episodes of  unsteadiness with abrupt turns, scanning, backing up and speed changes.  Stairs Stairs: Yes Stairs assistance: Min guard Stair Management: One rail Right;Alternating pattern;Forwards Number of Stairs: 5 General stair comments: stubbed R toe during ascent x 1  Wheelchair Mobility    Modified Rankin (Stroke Patients Only) Modified Rankin (Stroke Patients Only) Pre-Morbid Rankin Score: No symptoms Modified Rankin: Moderate disability     Balance Overall balance assessment: Needs assistance Sitting-balance support: Feet supported;No upper extremity supported Sitting balance-Leahy Scale: Good     Standing balance support: No upper extremity supported Standing balance-Leahy Scale: Fair                               Pertinent Vitals/Pain Pain Assessment: Faces Faces Pain Scale: No hurt Pain Location: Rt sided neck pain "stiff neck"  Pain Descriptors / Indicators: Aching Pain Intervention(s): Monitored during session    Home Living Family/patient expects to be discharged to:: Private residence Living Arrangements: Spouse/significant other Available Help at Discharge: Family Type of Home: House Home Access: Level entry     Home Layout: One level Home Equipment: Shower seat - built in;Grab bars - tub/shower Additional Comments: lives with wife     Prior Function Level of Independence: Independent         Comments: Pt reports he has had multiple falls in the past year he attributes to dizziness.  He reports he has become quite sendentary in the last  couple of years      Hand Dominance   Dominant Hand: Right    Extremity/Trunk Assessment   Upper Extremity Assessment Upper Extremity Assessment: Defer to OT evaluation    Lower Extremity Assessment Lower Extremity Assessment: Overall WFL for tasks assessed (mild proximal weakness,  but wholly functional)    Cervical / Trunk Assessment Cervical / Trunk Assessment: Normal  Communication    Communication: No difficulties  Cognition Arousal/Alertness: Awake/alert Behavior During Therapy: WFL for tasks assessed/performed Overall Cognitive Status: Within Functional Limits for tasks assessed                                        General Comments      Exercises     Assessment/Plan    PT Assessment Patent does not need any further PT services  PT Problem List         PT Treatment Interventions      PT Goals (Current goals can be found in the Care Plan section)  Acute Rehab PT Goals Patient Stated Goal: did not state  PT Goal Formulation: All assessment and education complete, DC therapy    Frequency     Barriers to discharge        Co-evaluation               AM-PAC PT "6 Clicks" Mobility  Outcome Measure Help needed turning from your back to your side while in a flat bed without using bedrails?: None Help needed moving from lying on your back to sitting on the side of a flat bed without using bedrails?: None Help needed moving to and from a bed to a chair (including a wheelchair)?: A Little Help needed standing up from a chair using your arms (e.g., wheelchair or bedside chair)?: A Little Help needed to walk in hospital room?: A Little Help needed climbing 3-5 steps with a railing? : A Little 6 Click Score: 20    End of Session   Activity Tolerance: Patient tolerated treatment well Patient left: in chair;with call bell/phone within reach;with family/visitor present Nurse Communication: Mobility status PT Visit Diagnosis: Unsteadiness on feet (R26.81);History of falling (Z91.81);Other symptoms and signs involving the nervous system (R29.898)    Time: 0045-9977 PT Time Calculation (min) (ACUTE ONLY): 28 min   Charges:   PT Evaluation $PT Eval Moderate Complexity: 1 Mod PT Treatments $Gait Training: 8-22 mins        06/05/2020  Ginger Carne., PT Acute Rehabilitation Services [phone]  (pager) [phone]   (office)  Tessie Fass Amaree Loisel 06/05/2020, 5:59 PM

## 2020-06-05 NOTE — Evaluation (Signed)
Occupational Therapy Evaluation Patient Details Name: Eddie Mullen MRN: [id number] DOB: 26-Jun-1933 Today's Date: 06/05/2020    History of Present Illness This 86 y.o. male admitted with seizure like episode.   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  repeat CT of head shows stable SDH.  PMH includes:  h/o falls, HTN, CAD, s/p coronary angioplasty with stent placement    Clinical Impression   Pt admitted with above. He demonstrates the below listed deficits and will benefit from continued OT to maximize safety and independence with BADLs.  Pt presents to OT with generalized weakness, impaired balance, and decreased activity tolerance.  He requires set up to min A for ADLs.  He lives with his wife at home, and reports he was fully independent.  Upon standing, pt reported dizziness.  While attempting to stand for 3 mins to get orthostatic reading, pt reported need to sit due to dizziness, associated with sudden onset of nausea with dry heaves.  Unable to obtain accurate 3 mins standing orthostatic pressure.  See below for the orthostatic readings that were taken.     06/05/20 1313  Orthostatic Lying   BP- Lying 125/64  Pulse- Lying 59  Orthostatic Sitting  BP- Sitting 112/70  Pulse- Sitting 64  Orthostatic Standing at 0 minutes  BP- Standing at 0 minutes 119/67         Follow Up Recommendations  No OT follow up;Supervision - Intermittent    Equipment Recommendations  None recommended by OT    Recommendations for Other Services       Precautions / Restrictions Precautions Precautions: Fall Precaution Comments: history of falls.  Pt reports he has been getting dizzy when getting up at night, and then had an episode before the seizure in which "everything went black"       Mobility Bed Mobility Overal bed mobility: Needs Assistance Bed Mobility: Supine to Sit     Supine to sit: Min guard     General bed  mobility comments: heavy reliance on bedrails     Transfers Overall transfer level: Needs assistance   Transfers: Sit to/from Stand;Stand Pivot Transfers Sit to Stand: Min guard Stand pivot transfers: Min assist       General transfer comment: assist to steady     Balance                                           ADL either performed or assessed with clinical judgement   ADL Overall ADL's : Needs assistance/impaired Eating/Feeding: Independent   Grooming: Wash/dry hands;Wash/dry face;Oral care;Brushing hair;Set up;Sitting   Upper Body Bathing: Set up;Supervision/ safety;Sitting   Lower Body Bathing: Minimal assistance;Sit to/from stand   Upper Body Dressing : Set up;Sitting   Lower Body Dressing: Minimal assistance;Sit to/from stand   Toilet Transfer: Minimal assistance;Stand-pivot;BSC   Toileting- Clothing Manipulation and Hygiene: Minimal assistance;Sit to/from stand       Functional mobility during ADLs: Minimal assistance General ADL Comments: pt requires min A due to mild balance deficits and report of dizziness      Vision Baseline Vision/History: Wears glasses Wears Glasses: At all times Patient Visual Report: No change from baseline Vision Assessment?: Yes Eye Alignment: Within Functional Limits Ocular Range of Motion: Within Functional Limits Alignment/Gaze Preference: Within Defined Limits Tracking/Visual Pursuits: Able to track stimulus in  all quads without difficulty     Perception Perception Perception Tested?: Yes   Praxis Praxis Praxis tested?: Within functional limits    Pertinent Vitals/Pain Pain Assessment: Faces Faces Pain Scale: Hurts little more Pain Location: Rt sided neck pain "stiff neck"  Pain Descriptors / Indicators: Aching Pain Intervention(s): Monitored during session;Premedicated before session     Hand Dominance Right   Extremity/Trunk Assessment Upper Extremity Assessment Upper Extremity  Assessment: LUE deficits/detail LUE Deficits / Details: Pt reports he injured his shoulder pulling a hose and has had very limited ROM since that time    Lower Extremity Assessment Lower Extremity Assessment: Defer to PT evaluation   Cervical / Trunk Assessment Cervical / Trunk Assessment: Normal   Communication Communication Communication: No difficulties   Cognition Arousal/Alertness: Awake/alert Behavior During Therapy: WFL for tasks assessed/performed Overall Cognitive Status: Within Functional Limits for tasks assessed                                 General Comments: Pt scored 4/28 on the Short Blessed Test which does not indicate deficit with this test.  He was able to tell me details of his day including discussion he had with MD and events of the day    General Comments  while standing at EOB to take 3 minute orthostatic pressures, pt reported feeling dizzy and needing to return to sitting, then had sudden onset of nausea with dry heaving.  Unable to get an accurate orthostatic reading at that time due to his return to sitting and holding onto trash can.  RN notified     Exercises     Shoulder Instructions      Home Living Family/patient expects to be discharged to:: Private residence Living Arrangements: Spouse/significant other Available Help at Discharge: Family Type of Home: House Home Access: Level entry     Home Layout: One level     Bathroom Shower/Tub: Occupational psychologist: Standard     Home Equipment: [place] - built in;Grab bars - tub/shower   Additional Comments: lives with wife       Prior Functioning/Environment Level of Independence: Independent        Comments: Pt reports he has had multiple falls in the past year he attributes to dizziness.  He reports he has become quite sendentary in the last couple of years         OT Problem List: Impaired balance (sitting and/or standing);Decreased activity  tolerance;Cardiopulmonary status limiting activity;Pain      OT Treatment/Interventions: Self-care/ADL training;DME and/or AE instruction;Therapeutic activities;Cognitive remediation/compensation;Patient/family education;Balance training    OT Goals(Current goals can be found in the care plan section) Acute Rehab OT Goals Patient Stated Goal: did not state  OT Goal Formulation: With patient/family Time For Goal Achievement: 06/19/20 Potential to Achieve Goals: Good ADL Goals Pt Will Perform Grooming: with supervision;standing Pt Will Perform Lower Body Bathing: with supervision;sit to/from stand Pt Will Perform Lower Body Dressing: with supervision;sit to/from stand Pt Will Transfer to Toilet: with supervision;ambulating;regular height toilet;grab bars Pt Will Perform Toileting - Clothing Manipulation and hygiene: with supervision;sit to/from stand  OT Frequency: Min 2X/week   Barriers to D/C:            Co-evaluation              AM-PAC OT "6 Clicks" Daily Activity     Outcome Measure Help from another person eating meals?: None  Help from another person taking care of personal grooming?: A Little Help from another person toileting, which includes using toliet, bedpan, or urinal?: A Little Help from another person bathing (including washing, rinsing, drying)?: A Little Help from another person to put on and taking off regular upper body clothing?: A Little Help from another person to put on and taking off regular lower body clothing?: A Little 6 Click Score: 19   End of Session Equipment Utilized During Treatment: Gait belt Nurse Communication: Mobility status  Activity Tolerance: Treatment limited secondary to medical complications (Comment) (nausea and dizziness ) Patient left: in bed;with call bell/phone within reach;with family/visitor present;with nursing/sitter in room  OT Visit Diagnosis: Unsteadiness on feet (R26.81)                Time: 1224-8250 OT Time  Calculation (min): 25 min Charges:  OT General Charges $OT Visit: 1 Visit OT Evaluation $OT Eval Moderate Complexity: 1 Mod OT Treatments $Therapeutic Activity: 8-22 mins  Nilsa Nutting., OTR/L Acute Rehabilitation Services Pager [phone] Office [phone]   Lucille Passy M 06/05/2020, 1:30 PM

## 2020-06-05 NOTE — ED Notes (Signed)
Attempted to give report and staff states they are talking to patient placement at this time.

## 2020-06-05 NOTE — H&P (Signed)
Providing Compassionate, Quality Care - Together  NEUROSURGERY HISTORY & PHYSICAL   Eddie Mullen is an 86 y.o. male.   Chief Complaint: Seizure HPI: This is a 86 year old male with a history of CKD, Barrett's esophagus, CAD and recent falls that complains of a seizure-like episode and was brought to the emergency department.  He states he has no pain, numbness, tingling or headaches at this time.  He was eating dinner when he had this questionable seizure-like episode.  He does not remember any significant details beyond that at which point EMS was at his house and brought him to the emergency department.  He states recently he has had multiple falls as early as a few days ago where he likely hit his head.  He does not take any antiplatelet or blood thinners at this time.  He has no history of seizures.  Past Medical History:  Diagnosis Date  . CAD (coronary artery disease)   . Hypertension     Past Surgical History:  Procedure Laterality Date  . CORONARY ANGIOPLASTY WITH STENT PLACEMENT    . HERNIA REPAIR      Family History  Problem Relation Age of Onset  . CVA Mother   . CAD Mother    Social History:  reports that he has never smoked. He has never used smokeless tobacco. He reports current alcohol use. He reports that he does not use drugs.  Allergies:  Allergies  Allergen Reactions  . Isosorbide     Other reaction(s): Dizziness  . Other Anaphylaxis    Stinging insects-any  . Ace Inhibitors     Other reaction(s): Unknown Proteinuria  . Penicillin G Rash    Other reaction(s): Headache  . Penicillins Rash    (Not in a hospital admission)   Results for orders placed or performed during the hospital encounter of 06/04/20 (from the past 48 hour(s))  CBC WITH DIFFERENTIAL     Status: Abnormal   Collection Time: 06/04/20 10:29 PM  Result Value Ref Range   WBC 15.8 (H) 4.0 - 10.5 K/uL   RBC 2.73 (L) 4.22 - 5.81 MIL/uL   Hemoglobin 9.1 (L) 13.0 - 17.0 g/dL    HCT 26.7 (L) 39 - 52 %   MCV 97.8 80.0 - 100.0 fL   MCH 33.3 26.0 - 34.0 pg   MCHC 34.1 30.0 - 36.0 g/dL   RDW 12.9 11.5 - 15.5 %   Platelets 207 150 - 400 K/uL   nRBC 0.0 0.0 - 0.2 %   Neutrophils Relative % 89 %   Neutro Abs 14.0 (H) 1.7 - 7.7 K/uL   Lymphocytes Relative 3 %   Lymphs Abs 0.5 (L) 0.7 - 4.0 K/uL   Monocytes Relative 7 %   Monocytes Absolute 1.1 (H) 0.1 - 1.0 K/uL   Eosinophils Relative 0 %   Eosinophils Absolute 0.0 0.0 - 0.5 K/uL   Basophils Relative 0 %   Basophils Absolute 0.1 0.0 - 0.1 K/uL   Immature Granulocytes 1 %   Abs Immature Granulocytes 0.13 (H) 0.00 - 0.07 K/uL    Comment: Performed at [facility], [address], [place], [place] [address]  Comprehensive metabolic panel     Status: Abnormal   Collection Time: 06/04/20 10:29 PM  Result Value Ref Range   Sodium 132 (L) 135 - 145 mmol/L   Potassium 4.1 3.5 - 5.1 mmol/L   Chloride 100 98 - 111 mmol/L   CO2 18 (L) 22 - 32  mmol/L   Glucose, Bld 182 (H) 70 - 99 mg/dL    Comment: Glucose reference range applies only to samples taken after fasting for at least 8 hours.   BUN 40 (H) 8 - 23 mg/dL   Creatinine, Ser 4.01 (H) 0.61 - 1.24 mg/dL   Calcium 8.8 (L) 8.9 - 10.3 mg/dL   Total Protein 6.1 (L) 6.5 - 8.1 g/dL   Albumin 3.6 3.5 - 5.0 g/dL   AST 29 15 - 41 U/L   ALT 14 0 - 44 U/L   Alkaline Phosphatase 50 38 - 126 U/L   Total Bilirubin 0.7 0.3 - 1.2 mg/dL   GFR, Estimated 14 (L) >60 mL/min    Comment: (NOTE) Calculated using the CKD-EPI Creatinine Equation (2021)    Anion gap 14 5 - 15    Comment: Performed at [facility], [place] [address] [place], [place] [address]  Magnesium     Status: None   Collection Time: 06/04/20 10:29 PM  Result Value Ref Range   Magnesium 2.1 1.7 - 2.4 mg/dL    Comment: Performed at [facility], [place] [address], [place], [place] [address]  CBG monitoring, ED     Status: Abnormal   Collection Time: 06/04/20 10:31 PM  Result Value Ref Range    Glucose-Capillary 163 (H) 70 - 99 mg/dL    Comment: Glucose reference range applies only to samples taken after fasting for at least 8 hours.   CT HEAD WO CONTRAST  Result Date: 06/04/2020 CLINICAL DATA:  Seizure, dizziness, headaches EXAM: CT HEAD WITHOUT CONTRAST TECHNIQUE: Contiguous axial images were obtained from the base of the skull through the vertex without intravenous contrast. COMPARISON:  10/11/2017 FINDINGS: Brain: A right hemispheric subdural hematoma is noted measuring up to 12 mm in thickness. There is sulcal effacement throughout the right cerebral hemisphere, with no significant midline shift. No acute infarct. Lateral ventricles and midline structures are unremarkable. Vascular: No hyperdense vessel or unexpected calcification. Skull: Normal. Negative for fracture or focal lesion. Sinuses/Orbits: No acute finding. Other: None. IMPRESSION: 1. Right-sided subdural hematoma, with sulcal effacement throughout the right cerebral hemisphere but no significant midline shift. 2. No acute infarct. Critical Value/emergent results were called by telephone at the time of interpretation on 06/04/2020 at 11:17 pm to provider Sherwood Gambler , who verbally acknowledged these results. Electronically Signed   By: Randa Ngo M.D.   On: 06/04/2020 23:17    ROS 14 point review of systems was obtained which all pertinent positive negatives are listed in HPI above Blood pressure (!) 173/89, pulse 80, temperature 98 F (36.7 C), temperature source Oral, resp. rate 17, height 5\' 7"  (1.702 m), weight 59 kg, SpO2 100 %. Physical Exam  AOx3 PERRLA Face symmetric Sensory intact light touch Cranial nerves II through XII intact Slight left upper extremity drift Left upper extremity deltoid weakness, chronic Bilateral lower extremity 5/5 strength   Assessment/Plan 86 year old male with  1.  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   Falls  -Admit to ICU, every hour neuro checks -Repeat CT in a.m. -Hold all anticoagulation -Blood pressure control -Keppra for seizure prophylaxis -PT/OT eval -Seizure precautions -I discussed at length the possibility of need for surgical drainage of the subdural hematoma due to the size and local mass-effect and irritation causing seizures.  He understands and I answered all of his questions.    Thank you for allowing me to participate in this  patient's care.  Please do not hesitate to call with questions or concerns.   Elwin Sleight, [facility] Cell: [phone]

## 2020-06-05 NOTE — Progress Notes (Signed)
Patient discharged, all questions answered.  All belongings sent home with patient and he left with his wife.

## 2020-06-08 ENCOUNTER — Other Ambulatory Visit: Payer: Self-pay | Admitting: Neurological Surgery

## 2020-06-08 DIAGNOSIS — S065X9A Traumatic subdural hemorrhage with loss of consciousness of unspecified duration, initial encounter: Secondary | ICD-10-CM

## 2020-06-08 DIAGNOSIS — S065XAA Traumatic subdural hemorrhage with loss of consciousness status unknown, initial encounter: Secondary | ICD-10-CM

## 2020-06-23 ENCOUNTER — Ambulatory Visit
Admission: RE | Admit: 2020-06-23 | Discharge: 2020-06-23 | Disposition: A | Discharge status: Home / Self Care | Payer: Medicare Other | Source: Ambulatory Visit | Attending: Neurological Surgery | Admitting: Neurological Surgery

## 2020-06-23 ENCOUNTER — Other Ambulatory Visit: Payer: Self-pay

## 2020-06-23 DIAGNOSIS — S065XAA Traumatic subdural hemorrhage with loss of consciousness status unknown, initial encounter: Secondary | ICD-10-CM

## 2020-06-23 DIAGNOSIS — S065X9A Traumatic subdural hemorrhage with loss of consciousness of unspecified duration, initial encounter: Secondary | ICD-10-CM | POA: Diagnosis not present

## 2020-06-24 ENCOUNTER — Other Ambulatory Visit: Payer: Self-pay | Admitting: Neurological Surgery

## 2020-06-24 DIAGNOSIS — S065X9A Traumatic subdural hemorrhage with loss of consciousness of unspecified duration, initial encounter: Secondary | ICD-10-CM

## 2020-06-24 DIAGNOSIS — S065XAA Traumatic subdural hemorrhage with loss of consciousness status unknown, initial encounter: Secondary | ICD-10-CM

## 2020-07-30 DIAGNOSIS — S065XAA Traumatic subdural hemorrhage with loss of consciousness status unknown, initial encounter: Secondary | ICD-10-CM | POA: Insufficient documentation

## 2020-07-30 DIAGNOSIS — S065X9A Traumatic subdural hemorrhage with loss of consciousness of unspecified duration, initial encounter: Secondary | ICD-10-CM | POA: Insufficient documentation

## 2020-07-31 ENCOUNTER — Other Ambulatory Visit: Payer: Self-pay | Admitting: *Deleted

## 2020-07-31 ENCOUNTER — Telehealth: Payer: Self-pay | Admitting: *Deleted

## 2020-07-31 ENCOUNTER — Inpatient Hospital Stay
Admission: EM | Admit: 2020-07-31 | Discharge: 2020-08-03 | DRG: 280 | Disposition: A | Discharge status: Home / Self Care | Payer: Medicare Other | Attending: Hospitalist | Admitting: Hospitalist

## 2020-07-31 ENCOUNTER — Emergency Department: Payer: Medicare Other

## 2020-07-31 DIAGNOSIS — I5021 Acute systolic (congestive) heart failure: Secondary | ICD-10-CM | POA: Diagnosis present

## 2020-07-31 DIAGNOSIS — Z20822 Contact with and (suspected) exposure to covid-19: Secondary | ICD-10-CM | POA: Diagnosis present

## 2020-07-31 DIAGNOSIS — Z8249 Family history of ischemic heart disease and other diseases of the circulatory system: Secondary | ICD-10-CM

## 2020-07-31 DIAGNOSIS — Z955 Presence of coronary angioplasty implant and graft: Secondary | ICD-10-CM

## 2020-07-31 DIAGNOSIS — I2510 Atherosclerotic heart disease of native coronary artery without angina pectoris: Secondary | ICD-10-CM | POA: Diagnosis present

## 2020-07-31 DIAGNOSIS — Z880 Allergy status to penicillin: Secondary | ICD-10-CM

## 2020-07-31 DIAGNOSIS — E871 Hypo-osmolality and hyponatremia: Secondary | ICD-10-CM

## 2020-07-31 DIAGNOSIS — Z888 Allergy status to other drugs, medicaments and biological substances status: Secondary | ICD-10-CM

## 2020-07-31 DIAGNOSIS — N184 Chronic kidney disease, stage 4 (severe): Secondary | ICD-10-CM | POA: Diagnosis present

## 2020-07-31 DIAGNOSIS — Z8679 Personal history of other diseases of the circulatory system: Secondary | ICD-10-CM

## 2020-07-31 DIAGNOSIS — I214 Non-ST elevation (NSTEMI) myocardial infarction: Secondary | ICD-10-CM | POA: Diagnosis not present

## 2020-07-31 DIAGNOSIS — I10 Essential (primary) hypertension: Secondary | ICD-10-CM | POA: Diagnosis present

## 2020-07-31 DIAGNOSIS — Z79899 Other long term (current) drug therapy: Secondary | ICD-10-CM

## 2020-07-31 DIAGNOSIS — I509 Heart failure, unspecified: Secondary | ICD-10-CM

## 2020-07-31 DIAGNOSIS — D509 Iron deficiency anemia, unspecified: Secondary | ICD-10-CM

## 2020-07-31 DIAGNOSIS — N179 Acute kidney failure, unspecified: Secondary | ICD-10-CM | POA: Diagnosis present

## 2020-07-31 DIAGNOSIS — D649 Anemia, unspecified: Secondary | ICD-10-CM

## 2020-07-31 DIAGNOSIS — D631 Anemia in chronic kidney disease: Secondary | ICD-10-CM | POA: Diagnosis present

## 2020-07-31 DIAGNOSIS — I340 Nonrheumatic mitral (valve) insufficiency: Secondary | ICD-10-CM | POA: Diagnosis present

## 2020-07-31 DIAGNOSIS — Z8711 Personal history of peptic ulcer disease: Secondary | ICD-10-CM

## 2020-07-31 DIAGNOSIS — N189 Chronic kidney disease, unspecified: Secondary | ICD-10-CM

## 2020-07-31 DIAGNOSIS — I130 Hypertensive heart and chronic kidney disease with heart failure and stage 1 through stage 4 chronic kidney disease, or unspecified chronic kidney disease: Secondary | ICD-10-CM | POA: Diagnosis present

## 2020-07-31 LAB — CBC
HCT: 26.7 % — ABNORMAL LOW (ref 39.0–52.0)
Hemoglobin: 8.5 g/dL — ABNORMAL LOW (ref 13.0–17.0)
MCH: 32.4 pg (ref 26.0–34.0)
MCHC: 31.8 g/dL (ref 30.0–36.0)
MCV: 101.9 fL — ABNORMAL HIGH (ref 80.0–100.0)
Platelets: 454 10*3/uL — ABNORMAL HIGH (ref 150–400)
RBC: 2.62 MIL/uL — ABNORMAL LOW (ref 4.22–5.81)
RDW: 14.2 % (ref 11.5–15.5)
WBC: 11.5 10*3/uL — ABNORMAL HIGH (ref 4.0–10.5)
nRBC: 0 % (ref 0.0–0.2)

## 2020-07-31 LAB — TROPONIN I (HIGH SENSITIVITY): Troponin I (High Sensitivity): 4174 ng/L (ref <18)

## 2020-07-31 LAB — COMPREHENSIVE METABOLIC PANEL
ALT: 14 U/L (ref 0–44)
AST: 28 U/L (ref 15–41)
Albumin: 3.3 g/dL — ABNORMAL LOW (ref 3.5–5.0)
Alkaline Phosphatase: 55 U/L (ref 38–126)
Anion gap: 17 — ABNORMAL HIGH (ref 5–15)
BUN: 58 mg/dL — ABNORMAL HIGH (ref 8–23)
CO2: 20 mmol/L — ABNORMAL LOW (ref 22–32)
Calcium: 8.7 mg/dL — ABNORMAL LOW (ref 8.9–10.3)
Chloride: 93 mmol/L — ABNORMAL LOW (ref 98–111)
Creatinine, Ser: 4.03 mg/dL — ABNORMAL HIGH (ref 0.61–1.24)
GFR, Estimated: 14 mL/min — ABNORMAL LOW (ref >60)
Glucose, Bld: 184 mg/dL — ABNORMAL HIGH (ref 70–99)
Potassium: 4.5 mmol/L (ref 3.5–5.1)
Sodium: 130 mmol/L — ABNORMAL LOW (ref 135–145)
Total Bilirubin: 0.8 mg/dL (ref 0.3–1.2)
Total Protein: 6.7 g/dL (ref 6.5–8.1)

## 2020-07-31 LAB — BRAIN NATRIURETIC PEPTIDE: B Natriuretic Peptide: 4246 pg/mL — ABNORMAL HIGH (ref 0.0–100.0)

## 2020-07-31 LAB — LIPASE, BLOOD: Lipase: 58 U/L — ABNORMAL HIGH (ref 11–51)

## 2020-07-31 MED ORDER — FUROSEMIDE 10 MG/ML IJ SOLN
40.0000 mg | Freq: Once | INTRAMUSCULAR | Status: AC
  Administered 2020-07-31: 40 mg via INTRAVENOUS
  Filled 2020-07-31: qty 4

## 2020-07-31 MED ORDER — NITROGLYCERIN 0.4 MG SL SUBL
0.4000 mg | SUBLINGUAL_TABLET | SUBLINGUAL | Status: AC | PRN
  Administered 2020-07-31 – 2020-08-01 (×3): 0.4 mg via SUBLINGUAL
  Filled 2020-07-31 (×3): qty 1

## 2020-07-31 MED ORDER — ASPIRIN 81 MG PO CHEW
324.0000 mg | CHEWABLE_TABLET | Freq: Once | ORAL | Status: AC
  Administered 2020-07-31: 324 mg via ORAL
  Filled 2020-07-31: qty 4

## 2020-07-31 NOTE — ED Provider Notes (Signed)
[facility] Emergency Department Provider Note   ____________________________________________   I have reviewed the triage vital signs and the nursing notes.   HISTORY  Chief Complaint Chest Pain and Shortness of Breath   History limited by: Not Limited   HPI Eddie Mullen is a 87 y.o. male who presents to the emergency department today because of concerns for chest pain or shortness of breath.  Patient states that the chest pain started last night.  He had an episode where he had pain in the center front chest and some pain in his back.  It then went away however tonight the pain came back.  He states it was worse tonight.  He also had associated shortness of breath.  Family states that the patient has not been doing well since being placed on seizure medication roughly 6 weeks ago.  He has had some increased weakness.  Patient does endorse this as well.  Additionally he states his appetite has been not very good over the past couple of days. Denies any fevers.     Records reviewed. Per medical record review patient has a history of CAD, HTN.   Past Medical History:  Diagnosis Date  . CAD (coronary artery disease)   . Hypertension     Patient Active Problem List   Diagnosis Date Noted  . SDH (subdural hematoma) ([place]) 06/05/2020  . Iron deficiency anemia 10/14/2019  . Chest pain 03/23/2018    Past Surgical History:  Procedure Laterality Date  . CORONARY ANGIOPLASTY WITH STENT PLACEMENT    . HERNIA REPAIR      Prior to Admission medications   Medication Sig Start Date End Date Taking? Authorizing Provider  atorvastatin (LIPITOR) 40 MG tablet Take 1 tablet by mouth daily. 01/03/18   [provider]  cloNIDine (CATAPRES - DOSED IN MG/24 HR) 0.1 mg/24hr patch Place onto the skin. 03/20/20 03/20/21  [provider]  ferrous gluconate (FERGON) 324 MG tablet Take 324 mg by mouth 3 (three) times a week. 03/02/20   [provider]   isosorbide mononitrate (IMDUR) 30 MG 24 hr tablet Take 1 tablet by mouth daily. 03/21/18   [provider]  levETIRAcetam (KEPPRA) 500 MG tablet Take 1 tablet (500 mg total) by mouth 2 (two) times daily. 06/05/20   Dawley, Troy C, DO  metoprolol succinate (TOPROL-XL) 50 MG 24 hr tablet Take 75 mg by mouth daily. 03/07/18   [provider]  nitroGLYCERIN (NITROSTAT) 0.4 MG SL tablet Place 1 tablet under the tongue.    [provider]  sertraline (ZOLOFT) 50 MG tablet Take 1 tablet by mouth daily. 07/30/19 07/29/20  [provider]  zolpidem (AMBIEN) 10 MG tablet Take 10 mg by mouth at bedtime as needed for sleep. Take 1/2 tablet or whole tablet at bedtime    [provider]    Allergies Isosorbide, Other, Ace inhibitors, Penicillin g, and Penicillins  Family History  Problem Relation Age of Onset  . CVA Mother   . CAD Mother     Social History Social History   Tobacco Use  . Smoking status: Never Smoker  . Smokeless tobacco: Never Used  Substance Use Topics  . Alcohol use: Yes  . Drug use: Never    Review of Systems Constitutional: Positive for generalized weakness. Eyes: No visual changes. ENT: No sore throat. Cardiovascular: Denies chest pain. Respiratory: Positive for shortness of breath. Gastrointestinal: Positive for decreased oral intake.  Genitourinary: Negative for dysuria. Musculoskeletal: Negative for  back pain. Skin: Negative for rash. Neurological: Negative for headaches, focal weakness or numbness.  ____________________________________________   PHYSICAL EXAM:  VITAL SIGNS: ED Triage Vitals  Enc Vitals Group     BP 07/31/20 2200 (!) 161/98     Pulse Rate 07/31/20 2200 (!) 107     Resp 07/31/20 2200 (!) 22     Temp 07/31/20 2200 97.8 F (36.6 C)     Temp Source 07/31/20 2200 Oral     SpO2 07/31/20 2200 99 %     Weight 07/31/20 2201 135 lb (61.2 kg)     Height 07/31/20 2201 5\' 7"  (1.702 m)     Head  Circumference --      Peak Flow --      Pain Score 07/31/20 2201 5   Constitutional: Alert and oriented.  Eyes: Conjunctivae are normal.  ENT      Head: Normocephalic and atraumatic.      Nose: No congestion/rhinnorhea.      Mouth/Throat: Mucous membranes are moist.      Neck: No stridor. Hematological/Lymphatic/Immunilogical: No cervical lymphadenopathy. Cardiovascular: Normal rate, regular rhythm.  No murmurs, rubs, or gallops.  Respiratory: Normal respiratory effort without tachypnea nor retractions. Some expiratory wheezing.  Gastrointestinal: Soft and non tender. No rebound. No guarding.  Genitourinary: Deferred Musculoskeletal: Normal range of motion in all extremities. No lower extremity edema. Neurologic:  Normal speech and language. No gross focal neurologic deficits are appreciated.  Skin:  Skin is warm, dry and intact. No rash noted. Psychiatric: Mood and affect are normal. Speech and behavior are normal. Patient exhibits appropriate insight and judgment.  ____________________________________________    LABS (pertinent positives/negatives)  Lipase 58 CBC wbc 11.5, hgb 8.5, plt 454 CMP na 130, k 4.5, glu 184, cr 4.03  ____________________________________________   EKG  I, Nance Pear, attending physician, personally viewed and interpreted this EKG  EKG Time: 2154 Rate: 107 Rhythm: sinus tachycardia Axis: normal Intervals: qtc 446 QRS: narrow ST changes: no st elevation Impression: abnormal ekg  ____________________________________________    RADIOLOGY  CXR Pulmonary edema and small bilateral effusions  ____________________________________________   PROCEDURES  Procedures  ____________________________________________   INITIAL IMPRESSION / ASSESSMENT AND PLAN / ED COURSE  Pertinent labs & imaging results that were available during my care of the patient were reviewed by me and considered in my medical decision making (see chart for  details).   Patient presented to the emergency department today because of concerns for chest pain and shortness of breath.  Differential be broad including pneumonia pneumothorax PE ACS PE amongst other differentials.  X-ray is concerning for possible edema.  Will give patient dose of Lasix here in the emergency department. Awaiting trop and BNP at time of signout.   ____________________________________________   FINAL CLINICAL IMPRESSION(S) / ED DIAGNOSES  Shortness of breath  Note: This dictation was prepared with Dragon dictation. Any transcriptional errors that result from this process are unintentional     Nance Pear, MD 07/31/20 2307

## 2020-07-31 NOTE — Telephone Encounter (Signed)
I called Claiborne Billings at [facility] and let her know that we will call pt. And set him up for labs next week and make an appt for him the following week. She will really message to dr Sabra Heck.  I called the pt and left voicemail on his cell phone and told him the above and waiting for him to call back. I tried his home phone number but someone answered and said I have wrong number.

## 2020-07-31 NOTE — Telephone Encounter (Signed)
Triage Msg from: Claiborne Billings at [facility] office [phone] contacted the cancer ctr. Dr. Sabra Heck is requesting an apt for patient as soon as possible to set up IV iron for the patient. His next apt is scheduled in April with Dr. Janese Banks.  HGB at [facility] was 8.3 last week. Please advise and schedule accordingly. Claiborne Billings is requesting call back as well.    Specimen:  Blood  Ref Range & Units 8 d ago  WBC (White Blood Cell Count) 4.1 - 10.2 10^3/uL 9.2   RBC (Red Blood Cell Count) 4.69 - 6.13 10^6/uL 2.54Low   Hemoglobin 14.1 - 18.1 gm/dL 8.3Low   Hematocrit 40.0 - 52.0 % 24.6Low   MCV (Mean Corpuscular Volume) 80.0 - 100.0 fl 96.9   MCH (Mean Corpuscular Hemoglobin) 27.0 - 31.2 pg 32.7High   MCHC (Mean Corpuscular Hemoglobin Concentration) 32.0 - 36.0 gm/dL 33.7   Platelet Count 150 - 450 10^3/uL 225   RDW-CV (Red Cell Distribution Width) 11.6 - 14.8 % 14.0   MPV (Mean Platelet Volume) 9.4 - 12.4 fl 10.0   Neutrophils 1.50 - 7.80 10^3/uL 7.06   Lymphocytes 1.00 - 3.60 10^3/uL 1.13   Monocytes 0.00 - 1.50 10^3/uL 0.79   Eosinophils 0.00 - 0.55 10^3/uL 0.13   Basophils 0.00 - 0.09 10^3/uL 0.08   Neutrophil % 32.0 - 70.0 % 76.5High   Lymphocyte % 10.0 - 50.0 % 12.3   Monocyte % 4.0 - 13.0 % 8.6   Eosinophil % 1.0 - 5.0 % 1.4   Basophil% 0.0 - 2.0 % 0.9   Immature Granulocyte % <=0.7 % 0.3   Immature Granulocyte Count <=0.06 10^3/L 0.03

## 2020-07-31 NOTE — ED Triage Notes (Signed)
Pt presents to the [facility] via EMS from home with c/o chest pain and dyspnea that began at 2000 tonight. Pt states that he took 1 SL NTG with no change in pain. Pt noted to have audible wheezes upon arrival. Pt received 1 duoneb en route. Pt currently alert and oriented.

## 2020-07-31 NOTE — ED Provider Notes (Signed)
I assumed care of this patient approximate 2300.  Please have providers note for full details regarding patient's initial evaluation assessment.  In brief patient presents with history of coronary disease with previously placed multivessel stenting for assessment of some chest pain and shortness of breath associate with some wheezing that began last night.  Initial work-up concerning for possible new onset heart failure.  Patient has no history of COPD or asthma or tobacco abuse.  Suspect wheezing was related.  Heart failure evident on chest x-ray.  BNP is 4246 further supporting diagnosis of acute heart failure.  Patient has no known history of heart failure.  Initial EKG shows some nonspecific changes initial troponin is noted to be elevated at 4174.  Patient given ASA and sublingual nitro.  Discussed patient's presentation and initial work-up with on-call cardiologist Dr. Saralyn Pilar recommended holding heparin at this time given anemia and recent subdural with plan for him to see the patient tomorrow.  I will admit to hospitalist service.  .Critical Care Performed by: Lucrezia Starch, MD Authorized by: Lucrezia Starch, MD   Critical care provider statement:    Critical care time (minutes):  45   Critical care time was exclusive of:  Separately billable procedures and treating other patients   Critical care was necessary to treat or prevent imminent or life-threatening deterioration of the following conditions:  Cardiac failure   Critical care was time spent personally by me on the following activities:  Discussions with consultants, evaluation of patient's response to treatment, examination of patient, ordering and performing treatments and interventions, ordering and review of laboratory studies, ordering and review of radiographic studies, pulse oximetry, re-evaluation of patient's condition, obtaining history from patient or surrogate and review of old charts   Medications  furosemide (LASIX)  injection 40 mg (40 mg Intravenous Given 07/31/20 2300)  aspirin chewable tablet 324 mg (324 mg Oral Given 07/31/20 2318)  nitroGLYCERIN (NITROSTAT) SL tablet 0.4 mg (0.4 mg Sublingual Given 08/01/20 0008)      Lucrezia Starch, MD 08/01/20 0040

## 2020-08-01 ENCOUNTER — Inpatient Hospital Stay
Admit: 2020-08-01 | Discharge: 2020-08-01 | Disposition: A | Discharge status: Home / Self Care | Payer: Medicare Other | Attending: Internal Medicine | Admitting: Internal Medicine

## 2020-08-01 ENCOUNTER — Other Ambulatory Visit: Payer: Self-pay

## 2020-08-01 DIAGNOSIS — I2510 Atherosclerotic heart disease of native coronary artery without angina pectoris: Secondary | ICD-10-CM | POA: Diagnosis present

## 2020-08-01 DIAGNOSIS — I340 Nonrheumatic mitral (valve) insufficiency: Secondary | ICD-10-CM | POA: Diagnosis present

## 2020-08-01 DIAGNOSIS — D631 Anemia in chronic kidney disease: Secondary | ICD-10-CM | POA: Diagnosis present

## 2020-08-01 DIAGNOSIS — Z8679 Personal history of other diseases of the circulatory system: Secondary | ICD-10-CM

## 2020-08-01 DIAGNOSIS — D509 Iron deficiency anemia, unspecified: Secondary | ICD-10-CM | POA: Diagnosis present

## 2020-08-01 DIAGNOSIS — I5021 Acute systolic (congestive) heart failure: Secondary | ICD-10-CM | POA: Diagnosis present

## 2020-08-01 DIAGNOSIS — I214 Non-ST elevation (NSTEMI) myocardial infarction: Principal | ICD-10-CM

## 2020-08-01 DIAGNOSIS — N179 Acute kidney failure, unspecified: Secondary | ICD-10-CM | POA: Diagnosis present

## 2020-08-01 DIAGNOSIS — Z8249 Family history of ischemic heart disease and other diseases of the circulatory system: Secondary | ICD-10-CM | POA: Diagnosis not present

## 2020-08-01 DIAGNOSIS — N184 Chronic kidney disease, stage 4 (severe): Secondary | ICD-10-CM | POA: Diagnosis present

## 2020-08-01 DIAGNOSIS — I509 Heart failure, unspecified: Secondary | ICD-10-CM

## 2020-08-01 DIAGNOSIS — Z955 Presence of coronary angioplasty implant and graft: Secondary | ICD-10-CM | POA: Diagnosis not present

## 2020-08-01 DIAGNOSIS — I130 Hypertensive heart and chronic kidney disease with heart failure and stage 1 through stage 4 chronic kidney disease, or unspecified chronic kidney disease: Secondary | ICD-10-CM | POA: Diagnosis present

## 2020-08-01 DIAGNOSIS — Z880 Allergy status to penicillin: Secondary | ICD-10-CM | POA: Diagnosis not present

## 2020-08-01 DIAGNOSIS — Z8711 Personal history of peptic ulcer disease: Secondary | ICD-10-CM | POA: Diagnosis not present

## 2020-08-01 DIAGNOSIS — E871 Hypo-osmolality and hyponatremia: Secondary | ICD-10-CM

## 2020-08-01 DIAGNOSIS — Z79899 Other long term (current) drug therapy: Secondary | ICD-10-CM | POA: Diagnosis not present

## 2020-08-01 DIAGNOSIS — Z888 Allergy status to other drugs, medicaments and biological substances status: Secondary | ICD-10-CM | POA: Diagnosis not present

## 2020-08-01 DIAGNOSIS — Z20822 Contact with and (suspected) exposure to covid-19: Secondary | ICD-10-CM | POA: Diagnosis present

## 2020-08-01 LAB — CBC
HCT: 25.9 % — ABNORMAL LOW (ref 39.0–52.0)
Hemoglobin: 8.5 g/dL — ABNORMAL LOW (ref 13.0–17.0)
MCH: 32.8 pg (ref 26.0–34.0)
MCHC: 32.8 g/dL (ref 30.0–36.0)
MCV: 100 fL (ref 80.0–100.0)
Platelets: 449 10*3/uL — ABNORMAL HIGH (ref 150–400)
RBC: 2.59 MIL/uL — ABNORMAL LOW (ref 4.22–5.81)
RDW: 14.2 % (ref 11.5–15.5)
WBC: 10.5 10*3/uL (ref 4.0–10.5)
nRBC: 0 % (ref 0.0–0.2)

## 2020-08-01 LAB — RESP PANEL BY RT-PCR (FLU A&B, COVID) ARPGX2
Influenza A by PCR: NEGATIVE
Influenza B by PCR: NEGATIVE
SARS Coronavirus 2 by RT PCR: NEGATIVE

## 2020-08-01 LAB — BASIC METABOLIC PANEL
Anion gap: 16 — ABNORMAL HIGH (ref 5–15)
BUN: 59 mg/dL — ABNORMAL HIGH (ref 8–23)
CO2: 22 mmol/L (ref 22–32)
Calcium: 9 mg/dL (ref 8.9–10.3)
Chloride: 95 mmol/L — ABNORMAL LOW (ref 98–111)
Creatinine, Ser: 4.19 mg/dL — ABNORMAL HIGH (ref 0.61–1.24)
GFR, Estimated: 13 mL/min — ABNORMAL LOW (ref >60)
Glucose, Bld: 144 mg/dL — ABNORMAL HIGH (ref 70–99)
Potassium: 4.7 mmol/L (ref 3.5–5.1)
Sodium: 133 mmol/L — ABNORMAL LOW (ref 135–145)

## 2020-08-01 LAB — LIPID PANEL
Cholesterol: 150 mg/dL (ref 0–200)
HDL: 76 mg/dL (ref >40)
LDL Cholesterol: 58 mg/dL (ref 0–99)
Total CHOL/HDL Ratio: 2 RATIO
Triglycerides: 79 mg/dL (ref <150)
VLDL: 16 mg/dL (ref 0–40)

## 2020-08-01 LAB — TROPONIN I (HIGH SENSITIVITY): Troponin I (High Sensitivity): 3644 ng/L (ref <18)

## 2020-08-01 MED ORDER — ATORVASTATIN CALCIUM 20 MG PO TABS
40.0000 mg | ORAL_TABLET | Freq: Every day | ORAL | Status: DC
  Administered 2020-08-01 – 2020-08-03 (×3): 40 mg via ORAL
  Filled 2020-08-01 (×3): qty 2

## 2020-08-01 MED ORDER — LEVETIRACETAM 500 MG PO TABS
500.0000 mg | ORAL_TABLET | Freq: Two times a day (BID) | ORAL | Status: DC
  Filled 2020-08-01: qty 1

## 2020-08-01 MED ORDER — ISOSORBIDE MONONITRATE ER 30 MG PO TB24
30.0000 mg | ORAL_TABLET | Freq: Every day | ORAL | Status: DC
  Administered 2020-08-01 – 2020-08-03 (×3): 30 mg via ORAL
  Filled 2020-08-01 (×3): qty 1

## 2020-08-01 MED ORDER — NITROGLYCERIN 0.4 MG SL SUBL: 0.4000 mg | SUBLINGUAL_TABLET | SUBLINGUAL | Status: DC | PRN

## 2020-08-01 MED ORDER — PHENYTOIN SODIUM EXTENDED 100 MG PO CAPS
200.0000 mg | ORAL_CAPSULE | Freq: Every day | ORAL | Status: DC
Start: 2020-08-01 — End: 2020-08-03
  Administered 2020-08-01 – 2020-08-02 (×2): 200 mg via ORAL
  Filled 2020-08-01 (×3): qty 2

## 2020-08-01 MED ORDER — ASPIRIN EC 81 MG PO TBEC
81.0000 mg | DELAYED_RELEASE_TABLET | Freq: Every day | ORAL | Status: DC
  Administered 2020-08-02 – 2020-08-03 (×2): 81 mg via ORAL
  Filled 2020-08-01 (×2): qty 1

## 2020-08-01 MED ORDER — ONDANSETRON HCL 4 MG/2ML IJ SOLN: 4.0000 mg | Freq: Four times a day (QID) | INTRAMUSCULAR | Status: DC | PRN

## 2020-08-01 MED ORDER — FUROSEMIDE 10 MG/ML IJ SOLN
40.0000 mg | Freq: Two times a day (BID) | INTRAMUSCULAR | Status: DC
  Administered 2020-08-01 – 2020-08-02 (×4): 40 mg via INTRAVENOUS
  Filled 2020-08-01 (×4): qty 4

## 2020-08-01 MED ORDER — METOPROLOL SUCCINATE ER 50 MG PO TB24
75.0000 mg | ORAL_TABLET | Freq: Every day | ORAL | Status: DC
Start: 2020-08-01 — End: 2020-08-03
  Administered 2020-08-01 – 2020-08-03 (×3): 75 mg via ORAL
  Filled 2020-08-01: qty 1
  Filled 2020-08-01: qty 2
  Filled 2020-08-01: qty 1

## 2020-08-01 MED ORDER — ACETAMINOPHEN 325 MG PO TABS: 650.0000 mg | ORAL_TABLET | ORAL | Status: DC | PRN

## 2020-08-01 NOTE — Consult Note (Signed)
[facility] Cardiology  CARDIOLOGY CONSULT NOTE  Patient ID: Eddie Mullen MRN: [id number] DOB/AGE: 01-18-1933 87 y.o.  Admit date: 07/31/2020 Referring Physician Billie Ruddy Primary Physician [facility] Primary Cardiologist  Reason for Consultation non-ST elevation myocardial infarction  HPI: 87 year old gentleman referred for evaluation of chest pain, shortness of breath, congestive heart failure, and non-ST elevation myocardial infarction.  The patient has been experiencing general decline in his health over the past 6 to 12 months.  He presents to [facility] ED with chief complaint of shortness of breath with associated chest tightness.  Upon arrival, ECG revealed sinus tachycardia with nonspecific ST abnormalities.  Chest x-ray revealed evidence for pulmonary edema.  Admission labs notable for elevated high sensitive troponin 4174 and 3644.  The patient has known history of coronary artery disease, status post Xience stent LAD, proximal RCA, RPL 3 at [facility] 04/29/2011.  Patient has recent history of subdural hematoma 06/23/2020 manifested with seizures on Dilantin.  The patient also has chronic anemia with hemoglobin hematocrit of 8.5 and 26.7, with recent history of melena.  In light of these comorbidities, I recommended against starting the patient on heparin drip.  Today, patient reports feeling somewhat better, denies chest pain though continues to experience shortness of breath due to apparent congestive heart failure.  Patient has stage IV chronic kidney disease with worsening BUN and creatinine of 58 and 4.03, respectively.  Review of systems complete and found to be negative unless listed above     Past Medical History:  Diagnosis Date  . CAD (coronary artery disease)   . Hypertension     Past Surgical History:  Procedure Laterality Date  . CORONARY ANGIOPLASTY WITH STENT PLACEMENT    . HERNIA REPAIR      (Not in a hospital admission)  Social History   Socioeconomic History  . Marital status: Married     Spouse name: Not on file  . Number of children: Not on file  . Years of education: Not on file  . Highest education level: Not on file  Occupational History  . Not on file  Tobacco Use  . Smoking status: Never Smoker  . Smokeless tobacco: Never Used  Substance and Sexual Activity  . Alcohol use: Yes  . Drug use: Never  . Sexual activity: Not on file  Other Topics Concern  . Not on file  Social History Narrative  . Not on file   Social Determinants of Health   Financial Resource Strain: Not on file  Food Insecurity: Not on file  Transportation Needs: Not on file  Physical Activity: Not on file  Stress: Not on file  Social Connections: Not on file  Intimate Partner Violence: Not on file    Family History  Problem Relation Age of Onset  . CVA Mother   . CAD Mother       Review of systems complete and found to be negative unless listed above      PHYSICAL EXAM  General: Well developed, well nourished, in no acute distress HEENT:  Normocephalic and atramatic Neck:  No JVD.  Lungs: Clear bilaterally to auscultation and percussion. Heart: HRRR . Normal S1 and S2 without gallops or murmurs.  Abdomen: Bowel sounds are positive, abdomen soft and non-tender  Msk:  Back normal, normal gait. Normal strength and tone for age. Extremities: No clubbing, cyanosis or edema.   Neuro: Alert and oriented X 3. Psych:  Good affect, responds appropriately  Labs:   Lab Results  Component Value Date   WBC  10.5 08/01/2020   HGB 8.5 (L) 08/01/2020   HCT 25.9 (L) 08/01/2020   MCV 100.0 08/01/2020   PLT 449 (H) 08/01/2020    Recent Labs  Lab 07/31/20 2158 08/01/20 0527  NA 130* 133*  K 4.5 4.7  CL 93* 95*  CO2 20* 22  BUN 58* 59*  CREATININE 4.03* 4.19*  CALCIUM 8.7* 9.0  PROT 6.7  --   BILITOT 0.8  --   ALKPHOS 55  --   ALT 14  --   AST 28  --   GLUCOSE 184* 144*   Lab Results  Component Value Date   CKTOTAL 80 06/21/2013   CKMB 1.8 06/21/2013   TROPONINI  0.09 (HH) 03/23/2018    Lab Results  Component Value Date   CHOL 150 08/01/2020   CHOL 149 03/24/2018   Lab Results  Component Value Date   HDL 76 08/01/2020   HDL 66 03/24/2018   Lab Results  Component Value Date   LDLCALC 58 08/01/2020   LDLCALC 48 03/24/2018   Lab Results  Component Value Date   TRIG 79 08/01/2020   TRIG 173 (H) 03/24/2018   Lab Results  Component Value Date   CHOLHDL 2.0 08/01/2020   CHOLHDL 2.3 03/24/2018   No results found for: LDLDIRECT    Radiology: DG Chest Portable 1 View  Result Date: 07/31/2020 CLINICAL DATA:  Chest pain and dyspnea. EXAM: PORTABLE CHEST 1 VIEW COMPARISON:  Radiograph 03/23/2018 FINDINGS: Borderline cardiomegaly. Diffuse interstitial and septal thickening typical of pulmonary edema. Small bilateral pleural effusions, left greater than right. Previous hiatal hernia is not well seen. No confluent consolidation. No pneumothorax. Chronic change of both shoulders. IMPRESSION: Pulmonary edema and small pleural effusions consistent with CHF. Electronically Signed   By: Keith Rake M.D.   On: 07/31/2020 22:27    EKG: Sinus tachycardia with nonspecific ST abnormalities  ASSESSMENT AND PLAN:   1.  Non-ST elevation myocardial infarction, with elevated high-sensitivity troponin, exacerbated by congestive heart failure and underlying chronic kidney disease, with history of known coronary artery disease, status post DES LAD, RCA, RPL3, 04/29/2011 at [facility], initially with chest pain, now chest pain-free with nondiagnostic ECG.  Heparin deferred in light of history of recent subdural hematoma, and ongoing iron deficiency anemia.  I had lengthy discussion with patient and patient's wife bowel initial conservative management since patient would be at high risk for contrast-induced nephrotoxicity, renal failure and potential dialysis, and they agree with deferring cardiac catheterization at this time in the absence of chest pain. 2.  Coronary artery  disease, status post DES LAD, RCA, RPL3, 04/29/2011 at [facility] 3.  Congestive heart failure, with chest x-ray revealing pulmonary edema, exacerbated by stage IV chronic kidney disease 4.  Chronic kidney disease, stage IV, BUN and creatinine 58 and 4.03 5.  Subdural hematoma 06/23/2020 with history of seizures on Dilantin 6.  Iron deficiency anemia, with history of melena, hemogram adequate 8.5 and 26.7  Recommendations  1.  Agree with current therapy 2.  Defer anticoagulation at this time 3.  Continue diuresis 4.  Carefully monitor renal status 5.  Consider renal consult 6.  Continue isosorbide mononitrate and metoprolol succinate 7.  Continue atorvastatin 8.  Review 2D echocardiogram 9.  Defer cardiac catheterization in light of patient's high risk for contrast-induced nephrotoxicity, renal failure with potential dialysis.  Patient and patient's wife agree with initial conservative management especially in the absence of ongoing chest pain.  Signed: Isaias Cowman MD,PhD, [facility] 08/01/2020, 9:05 AM

## 2020-08-01 NOTE — H&P (Signed)
History and Physical    Eddie Mullen ZOX:[id number] DOB: 1933/05/08 DOA: 07/31/2020  PCP: Rusty Aus, MD   Patient coming from: Home  I have personally briefly reviewed patient's old medical records in [place]  Chief Complaint: Chest pain shortness of breath  HPI: Eddie Mullen is a 87 y.o. male with medical history significant for CAD with history of stent angioplasty,  subdural hematoma in November 2021, presenting with seizure, now on phenytoin, CKD 4, and deficiency anemia awaiting heme-onc work-up, HTN and history of PUD who presents to the emergency room with chest pain and shortness of breath both starting the day prior to arrival.  Chest pain has been intermittent, moderate to severe intensity, radiating from front to back and has been getting progressively worse.  Improved with home sublingual nitroglycerin but not completely.  Additionally he has been having shortness of breath with no prior history of COPD on nebulizer use.  He denies cough fever chills, denies lower extremity pain or swelling ED course: On arrival afebrile, BP 161/98 pulse 107 respiratory rate 22 with O2 sat 99% on room air.  Troponin [id number] and BNP 4246.  CMP significant for sodium of 130 and creatinine of 4.03 which is his baseline.  WBC 11 500, hemoglobin 8.5 which is his baseline.  Lipase 58.   EKG as interpreted by me: Sinus tach at 107 with nonspecific ST-T wave changes Chest x-ray: Pulmonary edema consistent with CHF  Patient was given aspirin and sublingual nitroglycerin.  The emergency room provider spoke with on-call cardiologist Dr. Saralyn Pilar who recommended holding heparin due to recent subdural hematoma and admission to hospitalist service  Review of Systems: As per HPI otherwise all other systems on review of systems negative.    Past Medical History:  Diagnosis Date  . CAD (coronary artery disease)   . Hypertension     Past Surgical History:  Procedure Laterality Date   . CORONARY ANGIOPLASTY WITH STENT PLACEMENT    . HERNIA REPAIR       reports that he has never smoked. He has never used smokeless tobacco. He reports current alcohol use. He reports that he does not use drugs.  Allergies  Allergen Reactions  . Isosorbide     Other reaction(s): Dizziness  . Other Anaphylaxis    Stinging insects-any  . Ace Inhibitors     Other reaction(s): Unknown Proteinuria  . Penicillin G Rash    Other reaction(s): Headache  . Penicillins Rash    Family History  Problem Relation Age of Onset  . CVA Mother   . CAD Mother       Prior to Admission medications   Medication Sig Start Date End Date Taking? Authorizing Provider  atorvastatin (LIPITOR) 40 MG tablet Take 1 tablet by mouth daily. 01/03/18   [provider]  cloNIDine (CATAPRES - DOSED IN MG/24 HR) 0.1 mg/24hr patch Place onto the skin. 03/20/20 03/20/21  [provider]  ferrous gluconate (FERGON) 324 MG tablet Take 324 mg by mouth 3 (three) times a week. 03/02/20   [provider]  isosorbide mononitrate (IMDUR) 30 MG 24 hr tablet Take 1 tablet by mouth daily. 03/21/18   [provider]  levETIRAcetam (KEPPRA) 500 MG tablet Take 1 tablet (500 mg total) by mouth 2 (two) times daily. 06/05/20   Dawley, Troy C, DO  metoprolol succinate (TOPROL-XL) 50 MG 24 hr tablet Take 75 mg by mouth daily. 03/07/18   [provider]  nitroGLYCERIN (NITROSTAT) 0.4  MG SL tablet Place 1 tablet under the tongue.    [provider]  sertraline (ZOLOFT) 50 MG tablet Take 1 tablet by mouth daily. 07/30/19 07/29/20  [provider]  zolpidem (AMBIEN) 10 MG tablet Take 10 mg by mouth at bedtime as needed for sleep. Take 1/2 tablet or whole tablet at bedtime    [provider]    Physical Exam: Vitals:   08/01/20 0000 08/01/20 0015 08/01/20 0030 08/01/20 0045  BP:   (!) 148/100   Pulse: 67 93 95 86  Resp: (!) 23 18 20 19   Temp:      TempSrc:      SpO2: (!)  74% 100% 99% 99%  Weight:      Height:         Vitals:   08/01/20 0000 08/01/20 0015 08/01/20 0030 08/01/20 0045  BP:   (!) 148/100   Pulse: 67 93 95 86  Resp: (!) 23 18 20 19   Temp:      TempSrc:      SpO2: (!) 74% 100% 99% 99%  Weight:      Height:          Constitutional: Alert and oriented x 3 . Not in any apparent distress HEENT:      Head: Normocephalic and atraumatic.         Eyes: PERLA, EOMI, Conjunctivae are normal. Sclera is non-icteric.       Mouth/Throat: Mucous membranes are moist.       Neck: Supple with no signs of meningismus. Cardiovascular: Regular rate and rhythm. No murmurs, gallops, or rubs. 2+ symmetrical distal pulses are present . No JVD. No LE edema Respiratory: Respiratory effort normal .Lungs sounds clear bilaterally. No wheezes, crackles, or rhonchi.  Gastrointestinal: Soft, non tender, and non distended with positive bowel sounds.  Genitourinary: No CVA tenderness. Musculoskeletal: Nontender with normal range of motion in all extremities. No cyanosis, or erythema of extremities. Neurologic:  Face is symmetric. Moving all extremities. No gross focal neurologic deficits . Skin: Skin is warm, dry.  No rash or ulcers Psychiatric: Mood and affect are normal    Labs on Admission: I have personally reviewed following labs and imaging studies  CBC: Recent Labs  Lab 07/31/20 2158  WBC 11.5*  HGB 8.5*  HCT 26.7*  MCV 101.9*  PLT 161*   Basic Metabolic Panel: Recent Labs  Lab 07/31/20 2158  NA 130*  K 4.5  CL 93*  CO2 20*  GLUCOSE 184*  BUN 58*  CREATININE 4.03*  CALCIUM 8.7*   GFR: Estimated Creatinine Clearance: 11.6 mL/min (A) (by C-G formula based on SCr of 4.03 mg/dL (H)). Liver Function Tests: Recent Labs  Lab 07/31/20 2158  AST 28  ALT 14  ALKPHOS 55  BILITOT 0.8  PROT 6.7  ALBUMIN 3.3*   Recent Labs  Lab 07/31/20 2158  LIPASE 58*   No results for input(s): AMMONIA in the last 168 hours. Coagulation Profile: No  results for input(s): INR, PROTIME in the last 168 hours. Cardiac Enzymes: No results for input(s): CKTOTAL, CKMB, CKMBINDEX, TROPONINI in the last 168 hours. BNP (last 3 results) No results for input(s): PROBNP in the last 8760 hours. HbA1C: No results for input(s): HGBA1C in the last 72 hours. CBG: No results for input(s): GLUCAP in the last 168 hours. Lipid Profile: No results for input(s): CHOL, HDL, LDLCALC, TRIG, CHOLHDL, LDLDIRECT in the last 72 hours. Thyroid Function Tests: No results for input(s): TSH, T4TOTAL, FREET4, T3FREE, THYROIDAB  in the last 72 hours. Anemia Panel: No results for input(s): VITAMINB12, FOLATE, FERRITIN, TIBC, IRON, RETICCTPCT in the last 72 hours. Urine analysis: No results found for: COLORURINE, APPEARANCEUR, [place], [place], GLUCOSEU, HGBUR, BILIRUBINUR, KETONESUR, PROTEINUR, UROBILINOGEN, NITRITE, LEUKOCYTESUR  Radiological Exams on Admission: DG Chest Portable 1 View  Result Date: 07/31/2020 CLINICAL DATA:  Chest pain and dyspnea. EXAM: PORTABLE CHEST 1 VIEW COMPARISON:  Radiograph 03/23/2018 FINDINGS: Borderline cardiomegaly. Diffuse interstitial and septal thickening typical of pulmonary edema. Small bilateral pleural effusions, left greater than right. Previous hiatal hernia is not well seen. No confluent consolidation. No pneumothorax. Chronic change of both shoulders. IMPRESSION: Pulmonary edema and small pleural effusions consistent with CHF. Electronically Signed   By: Keith Rake M.D.   On: 07/31/2020 22:27     Assessment/Plan 87 year old male with history of  CAD with history of stent angioplasty,  subdural hematoma in November 2021, presenting with seizure, now on Keppra, CKD 4, and iron deficiency anemia awaiting heme-onc work-up, HTN and history of PUD presenting with 1 day history of chest pain and shortness of breath    NSTEMI (non-ST elevated myocardial infarction) ([place])   Two-vessel coronary artery disease with history of PCI   -  Patient presents with chest pain, improving with nitroglycerin - EKG nonacute but troponin above 4000 and trending down - Received aspirin - Cardiology consulted from the ER: No heparin as patient had a subdural hematoma in November 2021 - Continue metoprolol, atorvastatin, aspirin    Acute CHF (congestive heart failure) ([place]) - Patient also had shortness of breath.  BNP over 4000 and chest x-ray with pulmonary edema consistent with CHF - Suspect secondary to ACS above.  No prior history of CHF - IV Lasix, beta-blockers.  No ACE inhibitor due to CKD 4 - Daily weights with intake and output monitoring    Hyponatremia - Suspect hypovolemic secondary to CHF - Fluid restrict - Follow serum and urine osmolality and urine sodium    History of subdural hematoma November 2021 - Continue dilantin    Iron deficiency anemia   Anemia in stage 4 chronic kidney disease (HCC) - Hemoglobin at baseline at 8.5 - Continue ferrous gluconate    CKD (chronic kidney disease) stage 4, GFR 15-29 ml/min (HCC) - Creatinine 4.03 above baseline    Essential hypertension -Controlled.  On clonidine patch       DVT prophylaxis: SCDs Code Status: full code  Family Communication:  Wife at bedside  Disposition Plan: Back to previous home environment Consults called: Cardiology Status:At the time of admission, it appears that the appropriate admission status for this patient is INPATIENT. This is judged to be reasonable and necessary in order to provide the required intensity of service to ensure the patient's safety given the presenting symptoms, physical exam findings, and initial radiographic and laboratory data in the context of their  Comorbid conditions.   Patient requires inpatient status due to high intensity of service, high risk for further deterioration and high frequency of surveillance required.   I certify that at the point of admission it is my clinical judgment that the patient will require  inpatient hospital care spanning beyond [place] MD [facility]     08/01/2020, 1:01 AM

## 2020-08-01 NOTE — ED Notes (Signed)
Helped pt to toilet. 

## 2020-08-02 ENCOUNTER — Inpatient Hospital Stay: Payer: Medicare Other

## 2020-08-02 DIAGNOSIS — I5021 Acute systolic (congestive) heart failure: Secondary | ICD-10-CM

## 2020-08-02 LAB — BASIC METABOLIC PANEL
Anion gap: 13 (ref 5–15)
BUN: 93 mg/dL — ABNORMAL HIGH (ref 8–23)
CO2: 22 mmol/L (ref 22–32)
Calcium: 8.2 mg/dL — ABNORMAL LOW (ref 8.9–10.3)
Chloride: 97 mmol/L — ABNORMAL LOW (ref 98–111)
Creatinine, Ser: 4.13 mg/dL — ABNORMAL HIGH (ref 0.61–1.24)
GFR, Estimated: 13 mL/min — ABNORMAL LOW (ref >60)
Glucose, Bld: 110 mg/dL — ABNORMAL HIGH (ref 70–99)
Potassium: 4.3 mmol/L (ref 3.5–5.1)
Sodium: 132 mmol/L — ABNORMAL LOW (ref 135–145)

## 2020-08-02 LAB — ECHOCARDIOGRAM COMPLETE
Area-P 1/2: 5.84 cm2
Calc EF: 23.2 %
Height: 67 in
MV M vel: 5.72 m/s
MV Peak grad: 131 mmHg
S' Lateral: 3.96 cm
Single Plane A2C EF: 21.8 %
Single Plane A4C EF: 28.1 %
Weight: 2160 oz

## 2020-08-02 LAB — CBC
HCT: 20.8 % — ABNORMAL LOW (ref 39.0–52.0)
Hemoglobin: 6.9 g/dL — ABNORMAL LOW (ref 13.0–17.0)
MCH: 33 pg (ref 26.0–34.0)
MCHC: 33.2 g/dL (ref 30.0–36.0)
MCV: 99.5 fL (ref 80.0–100.0)
Platelets: 369 10*3/uL (ref 150–400)
RBC: 2.09 MIL/uL — ABNORMAL LOW (ref 4.22–5.81)
RDW: 14.3 % (ref 11.5–15.5)
WBC: 8.5 10*3/uL (ref 4.0–10.5)
nRBC: 0 % (ref 0.0–0.2)

## 2020-08-02 LAB — PREPARE RBC (CROSSMATCH)

## 2020-08-02 LAB — MAGNESIUM: Magnesium: 2.7 mg/dL — ABNORMAL HIGH (ref 1.7–2.4)

## 2020-08-02 LAB — ABO/RH: ABO/RH(D): A NEG

## 2020-08-02 MED ORDER — TRAZODONE HCL 50 MG PO TABS
50.0000 mg | ORAL_TABLET | Freq: Every evening | ORAL | Status: DC | PRN
  Administered 2020-08-02: 50 mg via ORAL
  Filled 2020-08-02: qty 1

## 2020-08-02 MED ORDER — SODIUM CHLORIDE 0.9% IV SOLUTION: Freq: Once | INTRAVENOUS | Status: DC

## 2020-08-02 NOTE — Progress Notes (Signed)
[facility] Cardiology  SUBJECTIVE: Patient laying comfortably in bed, denies chest pain or shortness of breath, feels much better   Vitals:   08/01/20 2344 08/01/20 2345 08/02/20 0500 08/02/20 0743  BP:  129/76  122/79  Pulse:  75  82  Resp:  16  18  Temp:  98.3 F (36.8 C)  98 F (36.7 C)  TempSrc:  Oral  Oral  SpO2:  (!) 77%  100%  Weight: 59.4 kg  59.4 kg   Height: 5\' 7"  (1.702 m)        Intake/Output Summary (Last 24 hours) at 08/02/2020 1325 Last data filed at 08/02/2020 1014 Gross per 24 hour  Intake 120 ml  Output 735 ml  Net -615 ml      PHYSICAL EXAM  General: Well developed, well nourished, in no acute distress HEENT:  Normocephalic and atramatic Neck:  No JVD.  Lungs: Clear bilaterally to auscultation and percussion. Heart: HRRR . Normal S1 and S2 without gallops or murmurs.  Abdomen: Bowel sounds are positive, abdomen soft and non-tender  Msk:  Back normal, normal gait. Normal strength and tone for age. Extremities: No clubbing, cyanosis or edema.   Neuro: Alert and oriented X 3. Psych:  Good affect, responds appropriately   LABS: Basic Metabolic Panel: Recent Labs    08/01/20 0527 08/02/20 0604  NA 133* 132*  K 4.7 4.3  CL 95* 97*  CO2 22 22  GLUCOSE 144* 110*  BUN 59* 93*  CREATININE 4.19* 4.13*  CALCIUM 9.0 8.2*  MG  --  2.7*   Liver Function Tests: Recent Labs    07/31/20 2158  AST 28  ALT 14  ALKPHOS 55  BILITOT 0.8  PROT 6.7  ALBUMIN 3.3*   Recent Labs    07/31/20 2158  LIPASE 58*   CBC: Recent Labs    08/01/20 0527 08/02/20 0604  WBC 10.5 8.5  HGB 8.5* 6.9*  HCT 25.9* 20.8*  MCV 100.0 99.5  PLT 449* 369   Cardiac Enzymes: No results for input(s): CKTOTAL, CKMB, CKMBINDEX, TROPONINI in the last 72 hours. BNP: Invalid input(s): POCBNP D-Dimer: No results for input(s): DDIMER in the last 72 hours. Hemoglobin A1C: No results for input(s): HGBA1C in the last 72 hours. Fasting Lipid Panel: Recent Labs    08/01/20 0527   CHOL 150  HDL 76  LDLCALC 58  TRIG 79  CHOLHDL 2.0   Thyroid Function Tests: No results for input(s): TSH, T4TOTAL, T3FREE, THYROIDAB in the last 72 hours.  Invalid input(s): FREET3 Anemia Panel: No results for input(s): VITAMINB12, FOLATE, FERRITIN, TIBC, IRON, RETICCTPCT in the last 72 hours.  US RENAL  Result Date: 08/02/2020 CLINICAL DATA:  Acute renal failure EXAM: RENAL / URINARY TRACT ULTRASOUND COMPLETE COMPARISON:  None. FINDINGS: Right Kidney: Renal measurements: 7.8 x 4.1 x 3.9 cm = volume: 65 mL. Cortex is echogenic and thinned. Cortex is heterogeneous but without discrete mass. Probable 9 mm nonobstructing stone. No hydronephrosis. Left Kidney: Renal measurements: 9.6 x 4.4 x 4.3 cm = volume: 95 mL. Cortex is echogenic and thinned. Cortex is heterogeneous but without discrete mass. No hydronephrosis. Bladder: Appears normal for degree of bladder distention. Other: Bilateral pleural effusions. IMPRESSION: 1. Both kidneys are small and echogenic with cortical thinning indicating chronic medical renal disease. No hydronephrosis. 2. Probable nonobstructing 9 mm RIGHT renal stone. 3. Bilateral pleural effusions. Electronically Signed   By: Franki Cabot M.D.   On: 08/02/2020 10:59   DG Chest Portable 1 View  Result  Date: 07/31/2020 CLINICAL DATA:  Chest pain and dyspnea. EXAM: PORTABLE CHEST 1 VIEW COMPARISON:  Radiograph 03/23/2018 FINDINGS: Borderline cardiomegaly. Diffuse interstitial and septal thickening typical of pulmonary edema. Small bilateral pleural effusions, left greater than right. Previous hiatal hernia is not well seen. No confluent consolidation. No pneumothorax. Chronic change of both shoulders. IMPRESSION: Pulmonary edema and small pleural effusions consistent with CHF. Electronically Signed   By: Keith Rake M.D.   On: 07/31/2020 22:27   ECHOCARDIOGRAM COMPLETE  Result Date: 08/02/2020    ECHOCARDIOGRAM REPORT   Patient Name:   Eddie Mullen Date of Exam:  08/01/2020 Medical Rec #:  [id number]        Height:       67.0 in Accession #:    [id number]       Weight:       135.0 lb Date of Birth:  1932/11/15         BSA:          60.711 m Patient Age:    68 years         BP:           152/96 mmHg Patient Gender: M                HR:           109 bpm. Exam Location:  [facility] Procedure: 2D Echo Indications:     CHF-ACUTE DIASTOLIC D66.44  History:         Patient has no prior history of Echocardiogram examinations.                  CHF, Acute MI, Signs/Symptoms:Chest Pain; Risk                  Factors:Hypertension.  Sonographer:     Avanell Shackleton Referring Phys:  [id number] Athena Masse Diagnosing Phys: Isaias Cowman MD IMPRESSIONS  1. Left ventricular ejection fraction, by estimation, is 35 to 40%. The left ventricle has moderately decreased function. The left ventricle demonstrates regional wall motion abnormalities (see scoring diagram/findings for description). Left ventricular  diastolic parameters were normal.  2. Right ventricular systolic function is normal. The right ventricular size is normal.  3. The mitral valve is normal in structure. Mild to moderate mitral valve regurgitation. No evidence of mitral stenosis.  4. The aortic valve is normal in structure. Aortic valve regurgitation is trivial. No aortic stenosis is present.  5. The inferior vena cava is normal in size with greater than 50% respiratory variability, suggesting right atrial pressure of 3 mmHg. FINDINGS  Left Ventricle: Left ventricular ejection fraction, by estimation, is 35 to 40%. The left ventricle has moderately decreased function. The left ventricle demonstrates regional wall motion abnormalities. The left ventricular internal cavity size was normal in size. There is no left ventricular hypertrophy. Left ventricular diastolic parameters were normal. Right Ventricle: The right ventricular size is normal. No increase in right ventricular wall thickness. Right ventricular systolic function is  normal. Left Atrium: Left atrial size was normal in size. Right Atrium: Right atrial size was normal in size. Pericardium: There is no evidence of pericardial effusion. Mitral Valve: The mitral valve is normal in structure. Mild to moderate mitral valve regurgitation. No evidence of mitral valve stenosis. Tricuspid Valve: The tricuspid valve is normal in structure. Tricuspid valve regurgitation is mild . No evidence of tricuspid stenosis. Aortic Valve: The aortic valve is normal in structure. Aortic valve regurgitation is trivial. No aortic stenosis is  present. Pulmonic Valve: The pulmonic valve was normal in structure. Pulmonic valve regurgitation is not visualized. No evidence of pulmonic stenosis. Aorta: The aortic root is normal in size and structure. Venous: The inferior vena cava is normal in size with greater than 50% respiratory variability, suggesting right atrial pressure of 3 mmHg. IAS/Shunts: No atrial level shunt detected by color flow Doppler.  LEFT VENTRICLE PLAX 2D LVIDd:         4.63 cm LVIDs:         3.96 cm LV PW:         1.10 cm LV IVS:        1.40 cm LVOT diam:     2.00 cm LVOT Area:     3.14 cm  LV Volumes (MOD) LV vol d, MOD A2C: 179.0 ml LV vol d, MOD A4C: 167.0 ml LV vol s, MOD A2C: 140.0 ml LV vol s, MOD A4C: 120.0 ml LV SV MOD A2C:     39.0 ml LV SV MOD A4C:     167.0 ml LV SV MOD BP:      40.3 ml RIGHT VENTRICLE         IVC TAPSE (M-mode): 2.0 cm  IVC diam: 2.16 cm LEFT ATRIUM              Index       RIGHT ATRIUM           Index LA diam:        5.10 cm  2.98 cm/m  RA Area:     15.50 cm LA Vol (A2C):   102.0 ml 59.61 ml/m RA Volume:   42.90 ml  25.07 ml/m LA Vol (A4C):   89.8 ml  52.48 ml/m LA Biplane Vol: 94.7 ml  55.34 ml/m   AORTA Ao Root diam: 3.60 cm MITRAL VALVE                TRICUSPID VALVE MV Area (PHT): 5.84 cm     TR Peak grad:   58.1 mmHg MV Decel Time: 130 msec     TR Vmax:        381.00 cm/s MR Peak grad: 131.0 mmHg MR Mean grad: 89.3 mmHg     SHUNTS MR Vmax:       572.33 cm/s   Systemic Diam: 2.00 cm MR Vmean:     445.3 cm/s MV E velocity: 103.00 cm/s MV A velocity: 47.70 cm/s MV E/A ratio:  2.16 Isaias Cowman MD Electronically signed by Isaias Cowman MD Signature Date/Time: 08/02/2020/10:51:20 AM    Final      Echo LVEF 35 to 40%  TELEMETRY: Sinus rhythm:  ASSESSMENT AND PLAN:  Active Problems:   Iron deficiency anemia   Anemia in stage 4 chronic kidney disease (HCC)   2-vessel coronary artery disease   CKD (chronic kidney disease) stage 4, GFR 15-29 ml/min (HCC)   Essential hypertension   History of peptic ulcer disease   History of subdural hematoma   Acute CHF (congestive heart failure) (HCC)   NSTEMI (non-ST elevated myocardial infarction) (HCC)   Hyponatremia    1.  Non-ST elevation myocardial infarction, with elevated high-sensitivity troponin, exacerbated by congestive heart failure and underlying chronic kidney disease, with history of known coronary artery disease, status post DES LAD, RCA, RPL3, 04/29/2011 at [facility], initially with chest pain, now chest pain-free with nondiagnostic ECG.  Heparin deferred in light of history of recent subdural hematoma, and ongoing iron deficiency anemia.  I had lengthy discussion  with patient and patient's wife about initial conservative management since patient would be at high risk for contrast-induced nephrotoxicity, renal failure and potential dialysis, and they agree with deferring cardiac catheterization at this time in the absence of chest pain. 2.  Coronary artery disease, status post DES LAD, RCA, RPL3, 04/29/2011 at [facility] 3.  Congestive heart failure, with chest x-ray revealing pulmonary edema, exacerbated by stage IV chronic kidney disease 4.  Chronic kidney disease, stage IV, BUN and creatinine 58 and 4.03, respectively.  Patient and patient's wife discussing with Dr. Candiss Norse about the potential for dialysis. 5.  Subdural hematoma 06/23/2020 with history of seizures on Dilantin 6.  Iron  deficiency anemia, with history of melena, hemogram adequate 8.5 and 26.7  Recommendations  1.  Agree with current therapy 2.  Defer anticoagulation at this time in light of recent history of subdural hematoma and ongoing iron deficiency anemia 3.  Continue diuresis 4.  Carefully monitor renal status 5.  Consider renal consult 6.  Continue isosorbide mononitrate and metoprolol succinate 7.  Continue atorvastatin 8.  Defer cardiac catheterization at this time in light of patient's high risk for contrast-induced nephrotoxicity, renal failure with potential dialysis. Patient and patient's wife agree with initial conservative management especially in the absence of ongoing chest pain.  Patient agrees to go on chronic hemodialysis, will reconsider cardiac catheterization at potential later date. 9.  Follow-up with me 1 week following discharge   Isaias Cowman, MD, PhD, [facility] 08/02/2020 1:25 PM

## 2020-08-02 NOTE — Progress Notes (Addendum)
PROGRESS NOTE    Eddie Mullen  RFF:[id number] DOB: 1932-10-20 DOA: 07/31/2020 PCP: Rusty Aus, MD    Assessment & Plan:   Active Problems:   Iron deficiency anemia   Anemia in stage 4 chronic kidney disease (HCC)   2-vessel coronary artery disease   CKD (chronic kidney disease) stage 4, GFR 15-29 ml/min (HCC)   Essential hypertension   History of peptic ulcer disease   History of subdural hematoma   Acute CHF (congestive heart failure) (HCC)   NSTEMI (non-ST elevated myocardial infarction) ([place])   Hyponatremia   Eddie Daniello Braxtonis a 87 y.o.malewith medical history significant forCAD with history of stent angioplasty, subdural hematoma in November 2021 with seizure now onphenytoin, CKD 4, and anemia awaiting heme-onc work-up, HTN and history of PUD who presented to the emergency room with chest pain and shortness of breath both starting the day prior to arrival.  NSTEMI (non-ST elevated myocardial infarction) ([place]) Two-vessel coronary artery disease with history of PCI  -Patient presented with chest pain, improving with nitroglycerin -EKG nonacute but troponin above 4000 and trending down -Received aspirin.  Heparin gtt not started due to recent hx of subdural hematoma. --cardiology consulted Plan: --defer cardiac cath since pt would be at high risk for contrast-induced nephrotoxicity given CKD -Continue metoprolol, atorvastatin, aspirin --cont Imdur --f/u with Dr. Saralyn Pilar 1 week after discharge  Acute systolic CHF (congestive heart failure) ([place]) -Patient also had shortness of breath. BNP over 4000 and chest x-ray with pulmonary edema consistent with CHF -Suspect secondary to ACS above. No prior history of CHF --Echo showed LVEF 35-40% with regional wall motion abnormalities. -started on IV Lasix. Plan: --cont IV lasix 40 mg BID --cont home Toprol --not on ACEi or ARBs due to CKD  Hyponatremia -Suspect hypervolemic secondary to  CHF Plan: --IV lasix 40 mg BID --trend  History of subdural hematomaNovember 2021 with seizure --cont dilantin  Iron deficiency anemia Anemia in stage 4 chronic kidney disease (HCC) -Hemoglobin 8.5 on presentation --Hgb trended down to 6.9 this morning Plan: --1u pRBC transfusion today --Monitor and transfuse to keep Hgb >=7  CKD (chronic kidney disease) stage 4, GFR 15-29 ml/min (HCC) -Baseline creatinine appears to be 3.6-4.0 from October 2021 to January 2022. --nephrology consulted  Essential hypertension -Controlled.    DVT prophylaxis: SCD/Compression stockings Code Status: Full code  Family Communication:  Status is: inpatient Dispo:   The patient is from: home Anticipated d/c is to: home Anticipated d/c date is: 1-2 days Patient currently is not medically ready to d/c due to: NSTEMI, acute CHF on IV lasix   Subjective and Interval History:  Pt reported feeling great.  No chest pain, no dyspnea.  Good urine output.     Objective: Vitals:   08/02/20 1945 08/03/20 0453 08/03/20 0950 08/03/20 1210  BP: 114/72 102/67 117/75 117/70  Pulse: 73 68 95 70  Resp: 18 16 18 16   Temp: 98 F (36.7 C) 97.8 F (36.6 C) 98.4 F (36.9 C) 97.6 F (36.4 C)  TempSrc: Oral Oral Oral Oral  SpO2: 99% 97% 99%   Weight:  58.8 kg    Height:       No intake or output data in the 24 hours ending 08/07/20 2201 Filed Weights   08/01/20 2344 08/02/20 0500 08/03/20 0453  Weight: 59.4 kg 59.4 kg 58.8 kg    Examination:   Constitutional: NAD, AAOx3 HEENT: conjunctivae and lids normal, EOMI CV: irregular, No cyanosis.   RESP: clear, normal respiratory effort  Extremities: No effusions, edema in BLE SKIN: warm, dry Neuro: II - XII grossly intact.   Psych: Normal mood and affect.  Appropriate judgement and reason   Data Reviewed: I have personally reviewed following labs and imaging studies  CBC: Recent Labs  Lab 08/01/20 0527 08/02/20 0604  08/03/20 0526  WBC 10.5 8.5 9.9  HGB 8.5* 6.9* 7.6*  HCT 25.9* 20.8* 22.8*  MCV 100.0 99.5 93.8  PLT 449* 369 482   Basic Metabolic Panel: Recent Labs  Lab 08/01/20 0527 08/02/20 0604 08/03/20 0526  NA 133* 132* 134*  K 4.7 4.3 3.7  CL 95* 97* 100  CO2 22 22 22   GLUCOSE 144* 110* 119*  BUN 59* 93* 89*  CREATININE 4.19* 4.13* 4.20*  CALCIUM 9.0 8.2* 8.0*  MG  --  2.7* 2.5*   GFR: Estimated Creatinine Clearance: 10.7 mL/min (A) (by C-G formula based on SCr of 4.2 mg/dL (H)). Liver Function Tests: No results for input(s): AST, ALT, ALKPHOS, BILITOT, PROT, ALBUMIN in the last 168 hours. No results for input(s): LIPASE, AMYLASE in the last 168 hours. No results for input(s): AMMONIA in the last 168 hours. Coagulation Profile: No results for input(s): INR, PROTIME in the last 168 hours. Cardiac Enzymes: No results for input(s): CKTOTAL, CKMB, CKMBINDEX, TROPONINI in the last 168 hours. BNP (last 3 results) No results for input(s): PROBNP in the last 8760 hours. HbA1C: No results for input(s): HGBA1C in the last 72 hours. CBG: No results for input(s): GLUCAP in the last 168 hours. Lipid Profile: No results for input(s): CHOL, HDL, LDLCALC, TRIG, CHOLHDL, LDLDIRECT in the last 72 hours. Thyroid Function Tests: No results for input(s): TSH, T4TOTAL, FREET4, T3FREE, THYROIDAB in the last 72 hours. Anemia Panel: No results for input(s): VITAMINB12, FOLATE, FERRITIN, TIBC, IRON, RETICCTPCT in the last 72 hours. Sepsis Labs: No results for input(s): PROCALCITON, LATICACIDVEN in the last 168 hours.  Recent Results (from the past 240 hour(s))  Resp Panel by RT-PCR (Flu A&B, Covid) Nasopharyngeal Swab     Status: None   Collection Time: 07/31/20 11:00 PM   Specimen: Nasopharyngeal Swab; Nasopharyngeal(NP) swabs in vial transport medium  Result Value Ref Range Status   SARS Coronavirus 2 by RT PCR NEGATIVE NEGATIVE Final    Comment: (NOTE) SARS-CoV-2 target nucleic acids are  NOT DETECTED.  The SARS-CoV-2 RNA is generally detectable in upper respiratory specimens during the acute phase of infection. The lowest concentration of SARS-CoV-2 viral copies this assay can detect is 138 copies/mL. A negative result does not preclude SARS-Cov-2 infection and should not be used as the sole basis for treatment or other patient management decisions. A negative result may occur with  improper specimen collection/handling, submission of specimen other than nasopharyngeal swab, presence of viral mutation(s) within the areas targeted by this assay, and inadequate number of viral copies(<138 copies/mL). A negative result must be combined with clinical observations, patient history, and epidemiological information. The expected result is Negative.  Fact Sheet for Patients:  EntrepreneurPulse.com.au  Fact Sheet for Healthcare Providers:  IncredibleEmployment.be  This test is no t yet approved or cleared by the [place] FDA and  has been authorized for detection and/or diagnosis of SARS-CoV-2 by FDA under an Emergency Use Authorization (EUA). This EUA will remain  in effect (meaning this test can be used) for the duration of the COVID-19 declaration under Section 564(b)(1) of the Act, 21 U.S.C.section 360bbb-3(b)(1), unless the authorization is terminated  or revoked sooner.  Influenza A by PCR NEGATIVE NEGATIVE Final   Influenza B by PCR NEGATIVE NEGATIVE Final    Comment: (NOTE) The Xpert Xpress SARS-CoV-2/FLU/RSV plus assay is intended as an aid in the diagnosis of influenza from Nasopharyngeal swab specimens and should not be used as a sole basis for treatment. Nasal washings and aspirates are unacceptable for Xpert Xpress SARS-CoV-2/FLU/RSV testing.  Fact Sheet for Patients: EntrepreneurPulse.com.au  Fact Sheet for Healthcare Providers: IncredibleEmployment.be  This test is not  yet approved or cleared by the [place] FDA and has been authorized for detection and/or diagnosis of SARS-CoV-2 by FDA under an Emergency Use Authorization (EUA). This EUA will remain in effect (meaning this test can be used) for the duration of the COVID-19 declaration under Section 564(b)(1) of the Act, 21 U.S.C. section 360bbb-3(b)(1), unless the authorization is terminated or revoked.  Performed at [facility], [address] [place], [place] [address]       Radiology Studies: No results found.   Scheduled Meds: Continuous Infusions:   LOS: 2 days     Enzo Bi, MD [facility] If 7PM-7AM, please contact night-coverage 08/07/2020, 10:01 PM

## 2020-08-02 NOTE — Plan of Care (Signed)

## 2020-08-02 NOTE — Consult Note (Signed)
[address] [place], [place] [address] Phone [phone]. Fax [phone]  Date: 08/02/2020                  Patient Name:  Eddie Mullen  MRN: [id number]  DOB: 1933-06-26  Age / Sex: 87 y.o., male         PCP: Rusty Aus, MD                 Service Requesting Consult: IM/ Enzo Bi, MD                 Reason for Consult: ARF            History of Present Illness: Patient is a 87 y.o. male  admitted to [facility] on 07/31/2020 for evaluation of NSTEMI (non-ST elevated myocardial infarction) ([place]) [I21.4] Acute heart failure, unspecified heart failure type ([place]) [I50.9] Anemia, unspecified type [D64.9] Chronic kidney disease, unspecified CKD stage [N18.9]  Patient presented to the emergency room via EMS from home for complaints of chest pain and dyspnea.  He was evaluated by cardiologist and diagnosed with non-STEMI.  Patient has known history of coronary disease with drug-eluting stent to LAD in October 2012.  Heparin deferred due to subdural hematoma.  Cardiac cath deferred due to renal insufficiency. Baseline creatinine appears to be 3.89/GFR 14 from June 05, 2020 Serum creatinine now staying between 4.03-4.13.  BUN is elevated to 93. Currently getting treatment with IV furosemide 40 mg twice a day No urinalysis or renal ultrasound is available  Medications: Outpatient medications: Medications Prior to Admission  Medication Sig Dispense Refill Last Dose  . atorvastatin (LIPITOR) 40 MG tablet Take 0.5 tablets by mouth.   07/31/2020 at Unknown time  . isosorbide mononitrate (IMDUR) 30 MG 24 hr tablet Take 1 tablet by mouth daily.   07/31/2020 at Unknown time  . metoprolol succinate (TOPROL-XL) 50 MG 24 hr tablet Take 75 mg by mouth daily.   07/31/2020 at Unknown time  . nitroGLYCERIN (NITROSTAT) 0.4 MG SL tablet Place 1 tablet under the tongue.   07/31/2020 at Unknown time  . pantoprazole (PROTONIX) 40 MG tablet Take by mouth.     . phenytoin (DILANTIN) 100 MG ER  capsule 2 capsules (200 mg total) nightly   07/31/2020 at Unknown time  . sertraline (ZOLOFT) 50 MG tablet Take 1 tablet by mouth daily.   07/31/2020 at Unknown time  . zolpidem (AMBIEN) 10 MG tablet Take 10 mg by mouth at bedtime as needed for sleep. Take 1/2 tablet or whole tablet at bedtime   07/31/2020 at Unknown time  . levETIRAcetam (KEPPRA) 500 MG tablet Take 1 tablet (500 mg total) by mouth 2 (two) times daily. (Patient not taking: Reported on 08/01/2020) 60 tablet 2 Not Taking at Unknown time    Current medications: Current Facility-Administered Medications  Medication Dose Route Frequency Provider Last Rate Last Admin  . acetaminophen (TYLENOL) tablet 650 mg  650 mg Oral Q4H PRN Athena Masse, MD      . aspirin EC tablet 81 mg  81 mg Oral Daily Athena Masse, MD   81 mg at 08/02/20 0815  . atorvastatin (LIPITOR) tablet 40 mg  40 mg Oral Daily Judd Gaudier V, MD   40 mg at 08/02/20 0813  . furosemide (LASIX) injection 40 mg  40 mg Intravenous BID Athena Masse, MD   40 mg at 08/02/20 0814  . isosorbide mononitrate (IMDUR) 24 hr tablet 30 mg  30 mg Oral  Daily Athena Masse, MD   30 mg at 08/02/20 5573  . metoprolol succinate (TOPROL-XL) 24 hr tablet 75 mg  75 mg Oral Daily Judd Gaudier V, MD   75 mg at 08/02/20 0814  . nitroGLYCERIN (NITROSTAT) SL tablet 0.4 mg  0.4 mg Sublingual Q5 min PRN Lucrezia Starch, MD      . nitroGLYCERIN (NITROSTAT) SL tablet 0.4 mg  0.4 mg Sublingual Q5 Min x 3 PRN Athena Masse, MD      . ondansetron [facility]) injection 4 mg  4 mg Intravenous Q6H PRN Athena Masse, MD      . phenytoin (DILANTIN) ER capsule 200 mg  200 mg Oral QHS Judd Gaudier V, MD   200 mg at 08/01/20 0500      Allergies: Allergies  Allergen Reactions  . Isosorbide     Other reaction(s): Dizziness  . Other Anaphylaxis    Stinging insects-any  . Ace Inhibitors     Other reaction(s): Unknown Proteinuria  . Penicillin G Rash    Other reaction(s): Headache  . Penicillins Rash       Past Medical History: Past Medical History:  Diagnosis Date  . CAD (coronary artery disease)   . Hypertension      Past Surgical History: Past Surgical History:  Procedure Laterality Date  . CORONARY ANGIOPLASTY WITH STENT PLACEMENT    . HERNIA REPAIR       Family History: Family History  Problem Relation Age of Onset  . CVA Mother   . CAD Mother      Social History: Social History   Socioeconomic History  . Marital status: Married    Spouse name: Not on file  . Number of children: Not on file  . Years of education: Not on file  . Highest education level: Not on file  Occupational History  . Not on file  Tobacco Use  . Smoking status: Never Smoker  . Smokeless tobacco: Never Used  Substance and Sexual Activity  . Alcohol use: Yes  . Drug use: Never  . Sexual activity: Not on file  Other Topics Concern  . Not on file  Social History Narrative  . Not on file   Social Determinants of Health   Financial Resource Strain: Not on file  Food Insecurity: Not on file  Transportation Needs: Not on file  Physical Activity: Not on file  Stress: Not on file  Social Connections: Not on file  Intimate Partner Violence: Not on file    Gen: Denies any fevers or chills HEENT: No vision or hearing problems CV: No chest pain or shortness of breath at present Resp: No cough or sputum production GI: No nausea, vomiting or diarrhea.  No blood in the stool GU : No problems with voiding.  No hematuria.  No previous history of kidney problems MS: Ambulatory.  Denies any acute joint pain or swelling Derm:   No complaints Psych: No complaints Heme: No complaints Neuro: No complaints Endocrine: No complaints     Vital Signs: Blood pressure 122/79, pulse 82, temperature 98 F (36.7 C), temperature source Oral, resp. rate 18, height 5\' 7"  (1.702 m), weight 59.4 kg, SpO2 100 %.   Intake/Output Summary (Last 24 hours) at 08/02/2020 0936 Last data filed at 08/02/2020  0700 Gross per 24 hour  Intake --  Output 735 ml  Net -735 ml    Weight trends: Filed Weights   07/31/20 2201 08/01/20 2344 08/02/20 0500  Weight: 61.2 kg 59.4 kg  59.4 kg   Physical Exam: General:  No acute distress, laying in the bed  HEENT  anicteric, moist oral mucous membrane  Pulm/lungs  normal breathing effort, lungs are clear to auscultation  CVS/Heart  regular rhythm, no rub or gallop  Abdomen:   Soft, nontender  Extremities:  No peripheral edema  Neurologic:  Alert, oriented, able to follow commands  Skin:  No acute rashes    Lab results: Basic Metabolic Panel: Recent Labs  Lab 07/31/20 2158 08/01/20 0527 08/02/20 0604  NA 130* 133* 132*  K 4.5 4.7 4.3  CL 93* 95* 97*  CO2 20* 22 22  GLUCOSE 184* 144* 110*  BUN 58* 59* 93*  CREATININE 4.03* 4.19* 4.13*  CALCIUM 8.7* 9.0 8.2*  MG  --   --  2.7*    Liver Function Tests: Recent Labs  Lab 07/31/20 2158  AST 28  ALT 14  ALKPHOS 55  BILITOT 0.8  PROT 6.7  ALBUMIN 3.3*   Recent Labs  Lab 07/31/20 2158  LIPASE 58*   No results for input(s): AMMONIA in the last 168 hours.  CBC: Recent Labs  Lab 08/01/20 0527 08/02/20 0604  WBC 10.5 8.5  HGB 8.5* 6.9*  HCT 25.9* 20.8*  MCV 100.0 99.5  PLT 449* 369    Cardiac Enzymes: No results for input(s): CKTOTAL, TROPONINI in the last 168 hours.  BNP: Invalid input(s): POCBNP  CBG: No results for input(s): GLUCAP in the last 168 hours.  Microbiology: Recent Results (from the past 720 hour(s))  Resp Panel by RT-PCR (Flu A&B, Covid) Nasopharyngeal Swab     Status: None   Collection Time: 07/31/20 11:00 PM   Specimen: Nasopharyngeal Swab; Nasopharyngeal(NP) swabs in vial transport medium  Result Value Ref Range Status   SARS Coronavirus 2 by RT PCR NEGATIVE NEGATIVE Final    Comment: (NOTE) SARS-CoV-2 target nucleic acids are NOT DETECTED.  The SARS-CoV-2 RNA is generally detectable in upper respiratory specimens during the acute phase of  infection. The lowest concentration of SARS-CoV-2 viral copies this assay can detect is 138 copies/mL. A negative result does not preclude SARS-Cov-2 infection and should not be used as the sole basis for treatment or other patient management decisions. A negative result may occur with  improper specimen collection/handling, submission of specimen other than nasopharyngeal swab, presence of viral mutation(s) within the areas targeted by this assay, and inadequate number of viral copies(<138 copies/mL). A negative result must be combined with clinical observations, patient history, and epidemiological information. The expected result is Negative.  Fact Sheet for Patients:  EntrepreneurPulse.com.au  Fact Sheet for Healthcare Providers:  IncredibleEmployment.be  This test is no t yet approved or cleared by the [place] FDA and  has been authorized for detection and/or diagnosis of SARS-CoV-2 by FDA under an Emergency Use Authorization (EUA). This EUA will remain  in effect (meaning this test can be used) for the duration of the COVID-19 declaration under Section 564(b)(1) of the Act, 21 U.S.C.section 360bbb-3(b)(1), unless the authorization is terminated  or revoked sooner.       Influenza A by PCR NEGATIVE NEGATIVE Final   Influenza B by PCR NEGATIVE NEGATIVE Final    Comment: (NOTE) The Xpert Xpress SARS-CoV-2/FLU/RSV plus assay is intended as an aid in the diagnosis of influenza from Nasopharyngeal swab specimens and should not be used as a sole basis for treatment. Nasal washings and aspirates are unacceptable for Xpert Xpress SARS-CoV-2/FLU/RSV testing.  Fact Sheet for Patients: EntrepreneurPulse.com.au  Fact Sheet  for Healthcare Providers: IncredibleEmployment.be  This test is not yet approved or cleared by the [place] and has been authorized for detection and/or diagnosis of SARS-CoV-2  by FDA under an Emergency Use Authorization (EUA). This EUA will remain in effect (meaning this test can be used) for the duration of the COVID-19 declaration under Section 564(b)(1) of the Act, 21 U.S.C. section 360bbb-3(b)(1), unless the authorization is terminated or revoked.  Performed at [facility], [address] [place], [place] [address]      Coagulation Studies: No results for input(s): LABPROT, INR in the last 72 hours.  Urinalysis: No results for input(s): COLORURINE, LABSPEC, PHURINE, GLUCOSEU, HGBUR, BILIRUBINUR, KETONESUR, PROTEINUR, UROBILINOGEN, NITRITE, LEUKOCYTESUR in the last 72 hours.  Invalid input(s): APPERANCEUR      Imaging: DG Chest Portable 1 View  Result Date: 07/31/2020 CLINICAL DATA:  Chest pain and dyspnea. EXAM: PORTABLE CHEST 1 VIEW COMPARISON:  Radiograph 03/23/2018 FINDINGS: Borderline cardiomegaly. Diffuse interstitial and septal thickening typical of pulmonary edema. Small bilateral pleural effusions, left greater than right. Previous hiatal hernia is not well seen. No confluent consolidation. No pneumothorax. Chronic change of both shoulders. IMPRESSION: Pulmonary edema and small pleural effusions consistent with CHF. Electronically Signed   By: Keith Rake M.D.   On: 07/31/2020 22:27      Assessment & Plan: Pt is a 87 y.o.   male with coronary artery disease with history of angioplasty, subdural hematoma November 2021, presenting with seizure, chronic kidney disease, anemia, hypertension, history of peptic ulcer disease, was admitted on 07/31/2020 with NSTEMI (non-ST elevated myocardial infarction) [facility]) [I21.4] Acute heart failure, unspecified heart failure type ([place]) [I50.9] Anemia, unspecified type [D64.9] Chronic kidney disease, unspecified CKD stage [N18.9]   #Acute kidney injury versus progression of underlying kidney disease to CKD stage V Baseline creatinine appears to be 3.6-4.0 from October 2021 to January  2022 Urinalysis is pending Renal ultrasound shows both kidneys are small and echogenic with cortical thinning.  Probable nonobstructing 9 mm right kidney stone with bilateral pleural effusions We had extensive discussion about chronic kidney disease and possibility of progression to end-stage renal disease Patient has not had education about chronic kidney disease, therefore he has not decided about dialysis versus nondialytic management yet. We will set up CKD education for patient and his wife.      LOS: 1 Eddie Mullen 1/9/20229:36 AM    Note: This note was prepared with Dragon dictation. Any transcription errors are unintentional

## 2020-08-03 LAB — CBC
HCT: 22.8 % — ABNORMAL LOW (ref 39.0–52.0)
Hemoglobin: 7.6 g/dL — ABNORMAL LOW (ref 13.0–17.0)
MCH: 31.3 pg (ref 26.0–34.0)
MCHC: 33.3 g/dL (ref 30.0–36.0)
MCV: 93.8 fL (ref 80.0–100.0)
Platelets: 342 10*3/uL (ref 150–400)
RBC: 2.43 MIL/uL — ABNORMAL LOW (ref 4.22–5.81)
RDW: 17.2 % — ABNORMAL HIGH (ref 11.5–15.5)
WBC: 9.9 10*3/uL (ref 4.0–10.5)
nRBC: 0 % (ref 0.0–0.2)

## 2020-08-03 LAB — BASIC METABOLIC PANEL
Anion gap: 12 (ref 5–15)
BUN: 89 mg/dL — ABNORMAL HIGH (ref 8–23)
CO2: 22 mmol/L (ref 22–32)
Calcium: 8 mg/dL — ABNORMAL LOW (ref 8.9–10.3)
Chloride: 100 mmol/L (ref 98–111)
Creatinine, Ser: 4.2 mg/dL — ABNORMAL HIGH (ref 0.61–1.24)
GFR, Estimated: 13 mL/min — ABNORMAL LOW (ref >60)
Glucose, Bld: 119 mg/dL — ABNORMAL HIGH (ref 70–99)
Potassium: 3.7 mmol/L (ref 3.5–5.1)
Sodium: 134 mmol/L — ABNORMAL LOW (ref 135–145)

## 2020-08-03 LAB — TYPE AND SCREEN
ABO/RH(D): A NEG
Antibody Screen: NEGATIVE
Unit division: 0

## 2020-08-03 LAB — MAGNESIUM: Magnesium: 2.5 mg/dL — ABNORMAL HIGH (ref 1.7–2.4)

## 2020-08-03 LAB — BPAM RBC
Blood Product Expiration Date: 202201162359
ISSUE DATE / TIME: 202201091636
Unit Type and Rh: 600

## 2020-08-03 MED ORDER — ATORVASTATIN CALCIUM 40 MG PO TABS: 40.0000 mg | ORAL_TABLET | Freq: Every day | ORAL | 2 refills | Status: AC

## 2020-08-03 MED ORDER — ASPIRIN 81 MG PO TBEC: 81.0000 mg | DELAYED_RELEASE_TABLET | Freq: Every day | ORAL | 11 refills | Status: AC

## 2020-08-03 MED ORDER — TORSEMIDE 20 MG PO TABS: 20.0000 mg | ORAL_TABLET | Freq: Every day | ORAL | 0 refills | Status: AC | PRN

## 2020-08-03 NOTE — Progress Notes (Signed)
[facility]  SUBJECTIVE: Patient laying comfortably in bed, denies chest pain or shortness of breath, feels much better overall   Vitals:   08/02/20 1628 08/02/20 1657 08/02/20 1945 08/03/20 0453  BP: 117/80 107/69 114/72 102/67  Pulse: 84 71 73 68  Resp: 18 18 18 16   Temp: (!) 97.4 F (36.3 C) 98.2 F (36.8 C) 98 F (36.7 C) 97.8 F (36.6 C)  TempSrc: Oral Oral Oral Oral  SpO2: 98% 100% 99% 97%  Weight:    58.8 kg  Height:         Intake/Output Summary (Last 24 hours) at 08/03/2020 1093 Last data filed at 08/02/2020 1939 Gross per 24 hour  Intake 728.75 ml  Output -  Net 728.75 ml      PHYSICAL EXAM  General: Well developed, well nourished, in no acute distress HEENT:  Normocephalic and atramatic Neck:  No JVD.  Lungs: Clear bilaterally to auscultation and percussion. Heart: HRRR . Normal S1 and S2 without gallops or murmurs.  Abdomen: Bowel sounds are positive, abdomen soft and non-tender  Msk:  Back normal, normal gait. Normal strength and tone for age. Extremities: No clubbing, cyanosis or edema.   Neuro: Alert and oriented X 3. Psych:  Good affect, responds appropriately   LABS: Basic Metabolic Panel: Recent Labs    08/02/20 0604 08/03/20 0526  NA 132* 134*  K 4.3 3.7  CL 97* 100  CO2 22 22  GLUCOSE 110* 119*  BUN 93* 89*  CREATININE 4.13* 4.20*  CALCIUM 8.2* 8.0*  MG 2.7* 2.5*   Liver Function Tests: Recent Labs    07/31/20 2158  AST 28  ALT 14  ALKPHOS 55  BILITOT 0.8  PROT 6.7  ALBUMIN 3.3*   Recent Labs    07/31/20 2158  LIPASE 58*   CBC: Recent Labs    08/02/20 0604 08/03/20 0526  WBC 8.5 9.9  HGB 6.9* 7.6*  HCT 20.8* 22.8*  MCV 99.5 93.8  PLT 369 342   Cardiac Enzymes: No results for input(s): CKTOTAL, CKMB, CKMBINDEX, TROPONINI in the last 72 hours. BNP: Invalid input(s): POCBNP D-Dimer: No results for input(s): DDIMER in the last 72 hours. Hemoglobin A1C: No results for input(s): HGBA1C in the last 72  hours. Fasting Lipid Panel: Recent Labs    08/01/20 0527  CHOL 150  HDL 76  LDLCALC 58  TRIG 79  CHOLHDL 2.0   Thyroid Function Tests: No results for input(s): TSH, T4TOTAL, T3FREE, THYROIDAB in the last 72 hours.  Invalid input(s): FREET3 Anemia Panel: No results for input(s): VITAMINB12, FOLATE, FERRITIN, TIBC, IRON, RETICCTPCT in the last 72 hours.  US RENAL  Result Date: 08/02/2020 CLINICAL DATA:  Acute renal failure EXAM: RENAL / URINARY TRACT ULTRASOUND COMPLETE COMPARISON:  None. FINDINGS: Right Kidney: Renal measurements: 7.8 x 4.1 x 3.9 cm = volume: 65 mL. Cortex is echogenic and thinned. Cortex is heterogeneous but without discrete mass. Probable 9 mm nonobstructing stone. No hydronephrosis. Left Kidney: Renal measurements: 9.6 x 4.4 x 4.3 cm = volume: 95 mL. Cortex is echogenic and thinned. Cortex is heterogeneous but without discrete mass. No hydronephrosis. Bladder: Appears normal for degree of bladder distention. Other: Bilateral pleural effusions. IMPRESSION: 1. Both kidneys are small and echogenic with cortical thinning indicating chronic medical renal disease. No hydronephrosis. 2. Probable nonobstructing 9 mm RIGHT renal stone. 3. Bilateral pleural effusions. Electronically Signed   By: Franki Cabot M.D.   On: 08/02/2020 10:59   ECHOCARDIOGRAM COMPLETE  Result Date: 08/02/2020  ECHOCARDIOGRAM REPORT   Patient Name:   Eddie Mullen Date of Exam: 08/01/2020 Medical Rec #:  [id number]        Height:       67.0 in Accession #:    [id number]       Weight:       135.0 lb Date of Birth:  Nov 18, 1932         BSA:          46.711 m Patient Age:    87 years         BP:           152/96 mmHg Patient Gender: M                HR:           109 bpm. Exam Location:  [facility] Procedure: 2D Echo Indications:     CHF-ACUTE DIASTOLIC F57.32  History:         Patient has no prior history of Echocardiogram examinations.                  CHF, Acute MI, Signs/Symptoms:Chest Pain; Risk                   Factors:Hypertension.  Sonographer:     Avanell Shackleton Referring Phys:  [id number] Athena Masse Diagnosing Phys: Isaias Cowman MD IMPRESSIONS  1. Left ventricular ejection fraction, by estimation, is 35 to 40%. The left ventricle has moderately decreased function. The left ventricle demonstrates regional wall motion abnormalities (see scoring diagram/findings for description). Left ventricular  diastolic parameters were normal.  2. Right ventricular systolic function is normal. The right ventricular size is normal.  3. The mitral valve is normal in structure. Mild to moderate mitral valve regurgitation. No evidence of mitral stenosis.  4. The aortic valve is normal in structure. Aortic valve regurgitation is trivial. No aortic stenosis is present.  5. The inferior vena cava is normal in size with greater than 50% respiratory variability, suggesting right atrial pressure of 3 mmHg. FINDINGS  Left Ventricle: Left ventricular ejection fraction, by estimation, is 35 to 40%. The left ventricle has moderately decreased function. The left ventricle demonstrates regional wall motion abnormalities. The left ventricular internal cavity size was normal in size. There is no left ventricular hypertrophy. Left ventricular diastolic parameters were normal. Right Ventricle: The right ventricular size is normal. No increase in right ventricular wall thickness. Right ventricular systolic function is normal. Left Atrium: Left atrial size was normal in size. Right Atrium: Right atrial size was normal in size. Pericardium: There is no evidence of pericardial effusion. Mitral Valve: The mitral valve is normal in structure. Mild to moderate mitral valve regurgitation. No evidence of mitral valve stenosis. Tricuspid Valve: The tricuspid valve is normal in structure. Tricuspid valve regurgitation is mild . No evidence of tricuspid stenosis. Aortic Valve: The aortic valve is normal in structure. Aortic valve regurgitation is trivial. No  aortic stenosis is present. Pulmonic Valve: The pulmonic valve was normal in structure. Pulmonic valve regurgitation is not visualized. No evidence of pulmonic stenosis. Aorta: The aortic root is normal in size and structure. Venous: The inferior vena cava is normal in size with greater than 50% respiratory variability, suggesting right atrial pressure of 3 mmHg. IAS/Shunts: No atrial level shunt detected by color flow Doppler.  LEFT VENTRICLE PLAX 2D LVIDd:         4.63 cm LVIDs:         3.96 cm  LV PW:         1.10 cm LV IVS:        1.40 cm LVOT diam:     2.00 cm LVOT Area:     3.14 cm  LV Volumes (MOD) LV vol d, MOD A2C: 179.0 ml LV vol d, MOD A4C: 167.0 ml LV vol s, MOD A2C: 140.0 ml LV vol s, MOD A4C: 120.0 ml LV SV MOD A2C:     39.0 ml LV SV MOD A4C:     167.0 ml LV SV MOD BP:      40.3 ml RIGHT VENTRICLE         IVC TAPSE (M-mode): 2.0 cm  IVC diam: 2.16 cm LEFT ATRIUM              Index       RIGHT ATRIUM           Index LA diam:        5.10 cm  2.98 cm/m  RA Area:     15.50 cm LA Vol (A2C):   102.0 ml 59.61 ml/m RA Volume:   42.90 ml  25.07 ml/m LA Vol (A4C):   89.8 ml  52.48 ml/m LA Biplane Vol: 94.7 ml  55.34 ml/m   AORTA Ao Root diam: 3.60 cm MITRAL VALVE                TRICUSPID VALVE MV Area (PHT): 5.84 cm     TR Peak grad:   58.1 mmHg MV Decel Time: 130 msec     TR Vmax:        381.00 cm/s MR Peak grad: 131.0 mmHg MR Mean grad: 89.3 mmHg     SHUNTS MR Vmax:      572.33 cm/s   Systemic Diam: 2.00 cm MR Vmean:     445.3 cm/s MV E velocity: 103.00 cm/s MV A velocity: 47.70 cm/s MV E/A ratio:  2.16 Isaias Cowman MD Electronically signed by Isaias Cowman MD Signature Date/Time: 08/02/2020/10:51:20 AM    Final      Echo LVEF 35 to 40%  TELEMETRY: Sinus rhythm:  ASSESSMENT AND PLAN:  Active Problems:   Iron deficiency anemia   Anemia in stage 4 chronic kidney disease (HCC)   2-vessel coronary artery disease   CKD (chronic kidney disease) stage 4, GFR 15-29 ml/min (HCC)    Essential hypertension   History of peptic ulcer disease   History of subdural hematoma   Acute CHF (congestive heart failure) (HCC)   NSTEMI (non-ST elevated myocardial infarction) (HCC)   Hyponatremia    1. Non-ST elevation myocardial infarction, with elevated high-sensitivity troponin, exacerbated by congestive heart failure and underlying chronic kidney disease, with history of known coronary artery disease, status post DES LAD, RCA, RPL3,04/29/2011 at [facility], initially with chest pain, now chest pain-free with nondiagnostic ECG. Heparin deferred in light of history of recent subdural hematoma, and ongoing iron deficiency anemia. I had lengthy discussion with patient and patient's wife about initial conservative management since patient would be at high risk for contrast-induced nephrotoxicity, renal failure and potential dialysis,and they agree with deferring cardiac catheterization at this time in the absence of chest pain. 2.Coronary artery disease, status post DES LAD, RCA, RPL3,04/29/2011 at [facility] 3.Acute systolic congestive heart failure, with chest x-ray revealing pulmonary edema, exacerbated by stage IV chronic kidney disease, much improved, oxygen saturation 98% on room air 4.Chronic kidney disease, stage IV, BUN and creatinine 58 and 4.03, respectively.  Patient and patient's wife discussing  with Dr. Candiss Norse about the potential for dialysis. 5.Subdural hematoma 06/23/2020 with history of seizures on Dilantin 6.Iron deficiency anemia, with history of melena, hemogram adequate 8.5 and 26.7  Recommendations  1.Agree with current therapy 2.Defer anticoagulation at this time in light of recent history of subdural hematoma and ongoing iron deficiency anemia 3.Continue diuresis 4.Carefully monitor renal status 5.Continue isosorbide mononitrate and metoprolol succinate 6.Continue atorvastatin 7.Defer cardiac catheterization at this time in light of patient's high  risk for contrast-induced nephrotoxicity, renal failure with potential dialysis. Patient and patient's wife agree with initial conservative management especially in the absence of ongoing chest pain.  Patient agrees to go on chronic hemodialysis, will reconsider cardiac catheterization at potential later date. 8.  Follow-up with me 1 week following discharge    Isaias Cowman, MD, PhD, [facility] 08/03/2020 9:18 AM

## 2020-08-03 NOTE — Discharge Summary (Signed)
Physician Discharge Summary   AMOUS CREWE  male DOB: December 27, 1932  OMV:[id number]  PCP: Rusty Aus, MD  Admit date: 07/31/2020 Discharge date: 08/03/2020  Admitted From: home Disposition:  home CODE STATUS: Full code     Hospital Course:  For full details, please see H&P, progress notes, consult notes and ancillary notes.  Briefly,  Jaleil Renwick Braxtonis a 87 y.o.malewith medical history significant forCAD with history of stent angioplasty, subdural hematoma in November 2021 with seizure now onphenytoin, CKD 4, and anemia awaiting heme-onc work-up, HTN and history of PUD who presentedto the emergency room with chest pain and shortness of breath both starting the day prior to arrival.  NSTEMI (non-ST elevated myocardial infarction) ([place]) Two-vessel coronary artery disease with history of PCI  Patient presentedwith chest pain, improving with nitroglycerin.  EKG nonacute but troponin above 4000 and trending down.  Received aspirin. Heparin gtt not started due to recent hx of subdural hematoma.  Cardiology consulted, however, cardiac cath deferred since pt would be at high risk for contrast-induced nephrotoxicity given CKD, and pt's chest pain had resolved.  Cardiology recommended metoprolol, atorvastatin, aspirin and Imdur.  Pt will follow up with Dr. Saralyn Pilar 1 week after discharge.  Acute systolic CHF (congestive heart failure) ([place]) Patient also had shortness of breath. BNP over 4000 and chest x-ray with pulmonary edema consistent with CHF.  No prior history of CHF.  Echo showed LVEF 35-40% with regional wall motion abnormalities.  Pt was started on IV Lasix 40 mg BID with improvement of dyspnea.  Home Toprol continued.  Pt not on ACEi or ARBs due to CKD.  Pt was transitioned to torsemide 20 mg daily per nephrology rec at discharge.  Hyponatremia, improved Na 130 on presentation.  Suspect hypervolemic secondary to CHF.  Pt received diuresis.  Na 134 prior to  discharge.  History of subdural hematomaNovember 2021 with seizure Continued home dilantin  Iron deficiency anemia Anemia in stage 4 chronic kidney disease (HCC) Hemoglobin 8.5 on presentation, however, trended down to 6.9 on 1/9.  Pt received 1u pRBC.  CKD (chronic kidney disease) stage 4, GFR 15-29 ml/min (HCC) -Baseline creatinine appears to be 3.6-4.0 from October 2021 to January 2022.  nephrology consulted.  Pt will continue outpatient followup with Dr. Candiss Norse.  Essential hypertension Controlled. continued home metop and Imdur.   Discharge Diagnoses:  Active Problems:   Iron deficiency anemia   Anemia in stage 4 chronic kidney disease (HCC)   2-vessel coronary artery disease   CKD (chronic kidney disease) stage 4, GFR 15-29 ml/min (HCC)   Essential hypertension   History of peptic ulcer disease   History of subdural hematoma   Acute CHF (congestive heart failure) (HCC)   NSTEMI (non-ST elevated myocardial infarction) ([place])   Hyponatremia    Discharge Instructions:  Allergies as of 08/03/2020      Reactions   Isosorbide    Other reaction(s): Dizziness   Other Anaphylaxis   Stinging insects-any   Ace Inhibitors    Other reaction(s): Unknown Proteinuria   Penicillin G Rash   Other reaction(s): Headache   Penicillins Rash      Medication List    STOP taking these medications   levETIRAcetam 500 MG tablet Commonly known as: KEPPRA     TAKE these medications   aspirin 81 MG EC tablet Take 1 tablet (81 mg total) by mouth daily. Swallow whole.   atorvastatin 40 MG tablet Commonly known as: LIPITOR Take 1 tablet (40 mg total) by  mouth daily. What changed: Another medication with the same name was removed. Continue taking this medication, and follow the directions you see here.   isosorbide mononitrate 30 MG 24 hr tablet Commonly known as: IMDUR Take 1 tablet by mouth daily.   metoprolol succinate 50 MG 24 hr tablet Commonly known as:  TOPROL-XL Take 75 mg by mouth daily.   nitroGLYCERIN 0.4 MG SL tablet Commonly known as: NITROSTAT Place 1 tablet under the tongue.   pantoprazole 40 MG tablet Commonly known as: PROTONIX Take by mouth.   phenytoin 100 MG ER capsule Commonly known as: DILANTIN 2 capsules (200 mg total) nightly   sertraline 50 MG tablet Commonly known as: ZOLOFT Take 1 tablet by mouth daily.   torsemide 20 MG tablet Commonly known as: Demadex Take 1 tablet (20 mg total) by mouth daily as needed (for swelling).   zolpidem 10 MG tablet Commonly known as: AMBIEN Take 10 mg by mouth at bedtime as needed for sleep. Take 1/2 tablet or whole tablet at bedtime        Follow-up Information    [facility] Follow up on 08/17/2020.   Specialty: Cardiology Why: at 2:00pm. Enter through the [facility] entrance. Contact information: [address] [place]       Murlean Iba, MD. Schedule an appointment as soon as possible for a visit on 08/13/2020.   Specialty: Nephrology Why: @ 10:40am Contact information: [place] [address] [phone]        Rusty Aus, MD. Schedule an appointment as soon as possible for a visit in 1 week.   Specialty: Internal Medicine Contact information: [place] [address] [phone]        Isaias Cowman, MD. Schedule an appointment as soon as possible for a visit in 1 week(s).   Specialty: Cardiology Contact information: [facility] [place] [address] [phone]               Allergies  Allergen Reactions  . Isosorbide     Other reaction(s): Dizziness  . Other Anaphylaxis    Stinging insects-any  . Ace Inhibitors     Other reaction(s): Unknown Proteinuria  . Penicillin G Rash    Other  reaction(s): Headache  . Penicillins Rash     The results of significant diagnostics from this hospitalization (including imaging, microbiology, ancillary and laboratory) are listed below for reference.   Consultations:   Procedures/Studies: US RENAL  Result Date: 08/02/2020 CLINICAL DATA:  Acute renal failure EXAM: RENAL / URINARY TRACT ULTRASOUND COMPLETE COMPARISON:  None. FINDINGS: Right Kidney: Renal measurements: 7.8 x 4.1 x 3.9 cm = volume: 65 mL. Cortex is echogenic and thinned. Cortex is heterogeneous but without discrete mass. Probable 9 mm nonobstructing stone. No hydronephrosis. Left Kidney: Renal measurements: 9.6 x 4.4 x 4.3 cm = volume: 95 mL. Cortex is echogenic and thinned. Cortex is heterogeneous but without discrete mass. No hydronephrosis. Bladder: Appears normal for degree of bladder distention. Other: Bilateral pleural effusions. IMPRESSION: 1. Both kidneys are small and echogenic with cortical thinning indicating chronic medical renal disease. No hydronephrosis. 2. Probable nonobstructing 9 mm RIGHT renal stone. 3. Bilateral pleural effusions. Electronically Signed   By: Franki Cabot M.D.   On: 08/02/2020 10:59   DG Chest Portable 1 View  Result Date: 07/31/2020 CLINICAL DATA:  Chest pain and dyspnea.  EXAM: PORTABLE CHEST 1 VIEW COMPARISON:  Radiograph 03/23/2018 FINDINGS: Borderline cardiomegaly. Diffuse interstitial and septal thickening typical of pulmonary edema. Small bilateral pleural effusions, left greater than right. Previous hiatal hernia is not well seen. No confluent consolidation. No pneumothorax. Chronic change of both shoulders. IMPRESSION: Pulmonary edema and small pleural effusions consistent with CHF. Electronically Signed   By: Keith Rake M.D.   On: 07/31/2020 22:27   ECHOCARDIOGRAM COMPLETE  Result Date: 08/02/2020    ECHOCARDIOGRAM REPORT   Patient Name:   CLEMENCE STILLINGS Date of Exam: 08/01/2020 Medical Rec #:  [id number]        Height:       67.0  in Accession #:    [id number]       Weight:       135.0 lb Date of Birth:  09-Mar-1933         BSA:          45.711 m Patient Age:    60 years         BP:           152/96 mmHg Patient Gender: M                HR:           109 bpm. Exam Location:  [facility] Procedure: 2D Echo Indications:     CHF-ACUTE DIASTOLIC H47.65  History:         Patient has no prior history of Echocardiogram examinations.                  CHF, Acute MI, Signs/Symptoms:Chest Pain; Risk                  Factors:Hypertension.  Sonographer:     Avanell Shackleton Referring Phys:  [id number] Athena Masse Diagnosing Phys: Isaias Cowman MD IMPRESSIONS  1. Left ventricular ejection fraction, by estimation, is 35 to 40%. The left ventricle has moderately decreased function. The left ventricle demonstrates regional wall motion abnormalities (see scoring diagram/findings for description). Left ventricular  diastolic parameters were normal.  2. Right ventricular systolic function is normal. The right ventricular size is normal.  3. The mitral valve is normal in structure. Mild to moderate mitral valve regurgitation. No evidence of mitral stenosis.  4. The aortic valve is normal in structure. Aortic valve regurgitation is trivial. No aortic stenosis is present.  5. The inferior vena cava is normal in size with greater than 50% respiratory variability, suggesting right atrial pressure of 3 mmHg. FINDINGS  Left Ventricle: Left ventricular ejection fraction, by estimation, is 35 to 40%. The left ventricle has moderately decreased function. The left ventricle demonstrates regional wall motion abnormalities. The left ventricular internal cavity size was normal in size. There is no left ventricular hypertrophy. Left ventricular diastolic parameters were normal. Right Ventricle: The right ventricular size is normal. No increase in right ventricular wall thickness. Right ventricular systolic function is normal. Left Atrium: Left atrial size was normal in size. Right  Atrium: Right atrial size was normal in size. Pericardium: There is no evidence of pericardial effusion. Mitral Valve: The mitral valve is normal in structure. Mild to moderate mitral valve regurgitation. No evidence of mitral valve stenosis. Tricuspid Valve: The tricuspid valve is normal in structure. Tricuspid valve regurgitation is mild . No evidence of tricuspid stenosis. Aortic Valve: The aortic valve is normal in structure. Aortic valve regurgitation is trivial. No aortic stenosis is present. Pulmonic Valve: The pulmonic valve was normal in  structure. Pulmonic valve regurgitation is not visualized. No evidence of pulmonic stenosis. Aorta: The aortic root is normal in size and structure. Venous: The inferior vena cava is normal in size with greater than 50% respiratory variability, suggesting right atrial pressure of 3 mmHg. IAS/Shunts: No atrial level shunt detected by color flow Doppler.  LEFT VENTRICLE PLAX 2D LVIDd:         4.63 cm LVIDs:         3.96 cm LV PW:         1.10 cm LV IVS:        1.40 cm LVOT diam:     2.00 cm LVOT Area:     3.14 cm  LV Volumes (MOD) LV vol d, MOD A2C: 179.0 ml LV vol d, MOD A4C: 167.0 ml LV vol s, MOD A2C: 140.0 ml LV vol s, MOD A4C: 120.0 ml LV SV MOD A2C:     39.0 ml LV SV MOD A4C:     167.0 ml LV SV MOD BP:      40.3 ml RIGHT VENTRICLE         IVC TAPSE (M-mode): 2.0 cm  IVC diam: 2.16 cm LEFT ATRIUM              Index       RIGHT ATRIUM           Index LA diam:        5.10 cm  2.98 cm/m  RA Area:     15.50 cm LA Vol (A2C):   102.0 ml 59.61 ml/m RA Volume:   42.90 ml  25.07 ml/m LA Vol (A4C):   89.8 ml  52.48 ml/m LA Biplane Vol: 94.7 ml  55.34 ml/m   AORTA Ao Root diam: 3.60 cm MITRAL VALVE                TRICUSPID VALVE MV Area (PHT): 5.84 cm     TR Peak grad:   58.1 mmHg MV Decel Time: 130 msec     TR Vmax:        381.00 cm/s MR Peak grad: 131.0 mmHg MR Mean grad: 89.3 mmHg     SHUNTS MR Vmax:      572.33 cm/s   Systemic Diam: 2.00 cm MR Vmean:     445.3 cm/s MV E  velocity: 103.00 cm/s MV A velocity: 47.70 cm/s MV E/A ratio:  2.16 Isaias Cowman MD Electronically signed by Isaias Cowman MD Signature Date/Time: 08/02/2020/10:51:20 AM    Final       Labs: BNP (last 3 results) Recent Labs    07/31/20 2158  BNP 9,518.8*   Basic Metabolic Panel: Recent Labs  Lab 07/31/20 2158 08/01/20 0527 08/02/20 0604 08/03/20 0526  NA 130* 133* 132* 134*  K 4.5 4.7 4.3 3.7  CL 93* 95* 97* 100  CO2 20* 22 22 22   GLUCOSE 184* 144* 110* 119*  BUN 58* 59* 93* 89*  CREATININE 4.03* 4.19* 4.13* 4.20*  CALCIUM 8.7* 9.0 8.2* 8.0*  MG  --   --  2.7* 2.5*   Liver Function Tests: Recent Labs  Lab 07/31/20 2158  AST 28  ALT 14  ALKPHOS 55  BILITOT 0.8  PROT 6.7  ALBUMIN 3.3*   Recent Labs  Lab 07/31/20 2158  LIPASE 58*   No results for input(s): AMMONIA in the last 168 hours. CBC: Recent Labs  Lab 07/31/20 2158 08/01/20 0527 08/02/20 0604 08/03/20 0526  WBC 11.5* 10.5 8.5 9.9  HGB  8.5* 8.5* 6.9* 7.6*  HCT 26.7* 25.9* 20.8* 22.8*  MCV 101.9* 100.0 99.5 93.8  PLT 454* 449* 369 342   Cardiac Enzymes: No results for input(s): CKTOTAL, CKMB, CKMBINDEX, TROPONINI in the last 168 hours. BNP: Invalid input(s): POCBNP CBG: No results for input(s): GLUCAP in the last 168 hours. D-Dimer No results for input(s): DDIMER in the last 72 hours. Hgb A1c No results for input(s): HGBA1C in the last 72 hours. Lipid Profile Recent Labs    08/01/20 0527  CHOL 150  HDL 76  LDLCALC 58  TRIG 79  CHOLHDL 2.0   Thyroid function studies No results for input(s): TSH, T4TOTAL, T3FREE, THYROIDAB in the last 72 hours.  Invalid input(s): FREET3 Anemia work up No results for input(s): VITAMINB12, FOLATE, FERRITIN, TIBC, IRON, RETICCTPCT in the last 72 hours. Urinalysis No results found for: COLORURINE, APPEARANCEUR, [place], [place], GLUCOSEU, [place], [place], [place], PROTEINUR, UROBILINOGEN, NITRITE, LEUKOCYTESUR Sepsis Labs Invalid  input(s): PROCALCITONIN,  WBC,  LACTICIDVEN Microbiology Recent Results (from the past 240 hour(s))  Resp Panel by RT-PCR (Flu A&B, Covid) Nasopharyngeal Swab     Status: None   Collection Time: 07/31/20 11:00 PM   Specimen: Nasopharyngeal Swab; Nasopharyngeal(NP) swabs in vial transport medium  Result Value Ref Range Status   SARS Coronavirus 2 by RT PCR NEGATIVE NEGATIVE Final    Comment: (NOTE) SARS-CoV-2 target nucleic acids are NOT DETECTED.  The SARS-CoV-2 RNA is generally detectable in upper respiratory specimens during the acute phase of infection. The lowest concentration of SARS-CoV-2 viral copies this assay can detect is 138 copies/mL. A negative result does not preclude SARS-Cov-2 infection and should not be used as the sole basis for treatment or other patient management decisions. A negative result may occur with  improper specimen collection/handling, submission of specimen other than nasopharyngeal swab, presence of viral mutation(s) within the areas targeted by this assay, and inadequate number of viral copies(<138 copies/mL). A negative result must be combined with clinical observations, patient history, and epidemiological information. The expected result is Negative.  Fact Sheet for Patients:  EntrepreneurPulse.com.au  Fact Sheet for Healthcare Providers:  IncredibleEmployment.be  This test is no t yet approved or cleared by the [place] FDA and  has been authorized for detection and/or diagnosis of SARS-CoV-2 by FDA under an Emergency Use Authorization (EUA). This EUA will remain  in effect (meaning this test can be used) for the duration of the COVID-19 declaration under Section 564(b)(1) of the Act, 21 U.S.C.section 360bbb-3(b)(1), unless the authorization is terminated  or revoked sooner.       Influenza A by PCR NEGATIVE NEGATIVE Final   Influenza B by PCR NEGATIVE NEGATIVE Final    Comment: (NOTE) The Xpert  Xpress SARS-CoV-2/FLU/RSV plus assay is intended as an aid in the diagnosis of influenza from Nasopharyngeal swab specimens and should not be used as a sole basis for treatment. Nasal washings and aspirates are unacceptable for Xpert Xpress SARS-CoV-2/FLU/RSV testing.  Fact Sheet for Patients: EntrepreneurPulse.com.au  Fact Sheet for Healthcare Providers: IncredibleEmployment.be  This test is not yet approved or cleared by the [place] FDA and has been authorized for detection and/or diagnosis of SARS-CoV-2 by FDA under an Emergency Use Authorization (EUA). This EUA will remain in effect (meaning this test can be used) for the duration of the COVID-19 declaration under Section 564(b)(1) of the Act, 21 U.S.C. section 360bbb-3(b)(1), unless the authorization is terminated or revoked.  Performed at [facility], [address] [place], [place] [address]  Total time spend on discharging this patient, including the last patient exam, discussing the hospital stay, instructions for ongoing care as it relates to all pertinent caregivers, as well as preparing the medical discharge records, prescriptions, and/or referrals as applicable, is 30 minutes.    Enzo Bi, MD  [facility] 08/03/2020, 10:37 AM

## 2020-08-03 NOTE — TOC Transition Note (Signed)
Transition of Care [facility]) - CM/SW Discharge Note   Patient Details  Name: JAHMIER WILLADSEN MRN: [id number] Date of Birth: 1933/05/12  [facility]) CM/SW Contact:  Kerin Salen, RN Phone Number: 08/03/2020, 10:41 AM   Final next level of care: Home/Self Care Barriers to Discharge: Barriers Resolved   Patient Goals and CMS Choice Patient states their goals for this hospitalization and ongoing recovery are:: To return home with wife.   Choice offered to / list presented to : NA  Discharge Placement                Patient to be transferred to facility by: Wife will transport patient home.   Patient and family notified of of transfer: 08/03/20  Discharge Plan and Services                DME Arranged: N/A DME Agency: NA       HH Arranged: NA HH Agency: NA        Social Determinants of Health (SDOH) Interventions     Readmission Risk Interventions No flowsheet data found.

## 2020-08-03 NOTE — Consult Note (Addendum)
   Heart failure nurse navigator note  HFrEF 35 to 40%.  Normal right ventricular systolic function.  Mild to moderate mitral regurgitation.  He presented to the emergency room with complaints of chest pain and shortness of breath.   Comorbidities:  Recent subdural hematoma Coronary artery disease with stenting in 2001 and 2012 Hypertension Kidney disease stage IV    Labs:  Magnesium 2.5, sodium 134, potassium 3.7, chloride 100, CO2 22, BUN 89, creatinine 4.2, yesterday 4.13, hemoglobin 7.6 up from 6.9 of yesterday, hematocrit 22.8 up from 20.8 of yesterday, platelet count 342. Weight 58.8 kg BMI 20.3 Blood pressure 117/70 Intake 728 ml Output not documented.    Assessment:  Eddie Mullen is awake and alert, states that he has slept the best he has in 4 nights last night.  Denies any chest pain or increasing shortness of breath, also states that his appetite is back.  HEENT- wears glasses, pupils are equal  Cardiac-heart tones are regular rate and rhythm no murmurs or rubs appreciated.  Chest- lungs are clear to posterior auscultation.  Abdomen-soft nontender positive for bowel sounds.  Legs-no lower extremity edema noted  Psych-is pleasant and appropriate, talkative and makes good eye contact   Neurologic-speech is clear moves all extremities without difficulty   Initial visit with patient.  He states that his nurse over the weekend had gone through the zone magnet in the heart failure teaching booklet.  Although he states that he did not understand that he had a lower ejection fraction than normal.  Again discussed low-sodium diet consisting of no more than 2000 mg in a 24-hour period, removing the salt from the table and also discussed fluid restriction of 64 ounces or eight 8 ounce cups within a 24-hour period he states that he does not come near to that with his intake at home.  Mostly just drinks when he is thirsty.  States that he and his wife do the cooking and  very rarely eat out at a restaurant  Discussed follow-up with the heart failure clinic, with Darylene Price NP.  Splane that she would be a good resource if he had questions or if he was in trouble.  He voices understanding.  He states in the past he had followed with a doctor at [facility] he is switching to [place] [facility] as he will not have to travel as much.  Made the patient aware of heart failure videos, but he states at this time that he feels that everything has been well explained and he is not interested in reviewing the videos at this time.  Spoke with patient's nurse, Lovena Le, as patient did not have any output documented yesterday.  She voices understanding.   Sonia Side RN, CHFN

## 2020-08-03 NOTE — Progress Notes (Signed)
Shirlee More to be D/C'd Home per MD order. Patient given discharge teaching and paperwork regarding medications, diet, follow-up appointments and activity. Patient understanding verbalized. No questions or complaints at this time. Skin condition as charted. IV and telemetry removed prior to leaving.  No further needs by Care Management/Social Work. Prescriptions e-prescribed by MD.  An After Visit Summary was printed and given to the patient.   Patient escorted via wheelchair  Terrilyn Saver

## 2020-08-05 ENCOUNTER — Telehealth: Payer: Self-pay

## 2020-08-05 NOTE — Telephone Encounter (Signed)
48 hour post discharge phone call.  Call to patient, call went directly to voice mail.  Left message, will try again later.  Pricilla Riffle RN, CHFN

## 2020-08-07 DIAGNOSIS — I5023 Acute on chronic systolic (congestive) heart failure: Secondary | ICD-10-CM | POA: Insufficient documentation

## 2020-08-14 ENCOUNTER — Telehealth: Payer: Self-pay | Admitting: Oncology

## 2020-08-14 NOTE — Progress Notes (Deleted)
   Patient ID: DOLTON LARDNER, male    DOB: Oct 07, 1932, 87 y.o.   MRN: [id number]  HPI  Mr Vereb is a 87 y/o male with a history of  Echo report from 08/01/20 reviewed and showed an EF of 35-40% along with mild/ moderate MR and no AS.   Admitted 07/31/20 due to chest pain (NSTEMI) and shortness of breath. Cardiology and nephrology consults obtained. EKG was nonacute. Recent subdural hematoma so heparin gtt not started. Cardiac cath deferred due to renal diease. Initially given IV lasix with transition to oral diuretics. No ACEi or ARB due to renal disease. Hyponatremic which improved after diuresis. Discharged after 3 days.   He presents today for his initial visit with a chief complaint of  Review of Systems    Physical Exam    Assessment & Plan:  1: Chronic heart failure with reduced ejection fraction- - NYHA class - BNP 07/31/20 was 4246.0  2: HTN- - BP - saw PCP Sabra Heck) 08/07/20 - BMP 08/07/20 reviewed and showed sodium 135, potassium 4.6, creatinine 4.1 & GFR 14  3: CKD- - saw nephology Candiss Norse) 08/13/20  4: Subdural hematoma-

## 2020-08-17 ENCOUNTER — Ambulatory Visit: Payer: Medicare Other | Admitting: Family

## 2020-08-17 NOTE — Telephone Encounter (Signed)
Error- the appt was 1/31

## 2020-08-17 NOTE — Telephone Encounter (Signed)
Wife had called due to PCP wanted pt to be seen soon due to  CKD 4- he and his wife do not want to pursue dialysis. Talked with him about the critical nature of controlling his fluid status then with a no salt diet or low-salt diet, renal ultrasound normal Anemia-transfusion x1, discharged hemoglobin 7.6, really needs erythropoietin either through renal or through hematology. Start Vitron-C daily. Wife was given an appt to see md on 1/27 wife agreeable to appt

## 2020-08-20 ENCOUNTER — Inpatient Hospital Stay: Payer: Medicare Other

## 2020-08-20 ENCOUNTER — Inpatient Hospital Stay (HOSPITAL_BASED_OUTPATIENT_CLINIC_OR_DEPARTMENT_OTHER): Payer: Medicare Other | Admitting: Oncology

## 2020-08-20 ENCOUNTER — Inpatient Hospital Stay: Payer: Medicare Other | Attending: Oncology

## 2020-08-20 VITALS — BP 143/90 | HR 74 | Temp 95.9°F | Resp 18 | Wt 136.0 lb

## 2020-08-20 DIAGNOSIS — N183 Chronic kidney disease, stage 3 unspecified: Secondary | ICD-10-CM | POA: Diagnosis not present

## 2020-08-20 DIAGNOSIS — D509 Iron deficiency anemia, unspecified: Secondary | ICD-10-CM

## 2020-08-20 DIAGNOSIS — N189 Chronic kidney disease, unspecified: Secondary | ICD-10-CM | POA: Diagnosis not present

## 2020-08-20 DIAGNOSIS — I2510 Atherosclerotic heart disease of native coronary artery without angina pectoris: Secondary | ICD-10-CM | POA: Insufficient documentation

## 2020-08-20 DIAGNOSIS — D631 Anemia in chronic kidney disease: Secondary | ICD-10-CM | POA: Diagnosis not present

## 2020-08-20 DIAGNOSIS — K219 Gastro-esophageal reflux disease without esophagitis: Secondary | ICD-10-CM | POA: Insufficient documentation

## 2020-08-20 DIAGNOSIS — K2270 Barrett's esophagus without dysplasia: Secondary | ICD-10-CM | POA: Insufficient documentation

## 2020-08-20 DIAGNOSIS — I5031 Acute diastolic (congestive) heart failure: Secondary | ICD-10-CM | POA: Diagnosis not present

## 2020-08-20 DIAGNOSIS — D649 Anemia, unspecified: Secondary | ICD-10-CM | POA: Diagnosis not present

## 2020-08-20 DIAGNOSIS — I130 Hypertensive heart and chronic kidney disease with heart failure and stage 1 through stage 4 chronic kidney disease, or unspecified chronic kidney disease: Secondary | ICD-10-CM | POA: Diagnosis not present

## 2020-08-20 DIAGNOSIS — Z79899 Other long term (current) drug therapy: Secondary | ICD-10-CM

## 2020-08-20 DIAGNOSIS — K209 Esophagitis, unspecified without bleeding: Secondary | ICD-10-CM | POA: Insufficient documentation

## 2020-08-20 LAB — COMPREHENSIVE METABOLIC PANEL
ALT: 29 U/L (ref 0–44)
AST: 20 U/L (ref 15–41)
Albumin: 3.6 g/dL (ref 3.5–5.0)
Alkaline Phosphatase: 60 U/L (ref 38–126)
Anion gap: 11 (ref 5–15)
BUN: 87 mg/dL — ABNORMAL HIGH (ref 8–23)
CO2: 20 mmol/L — ABNORMAL LOW (ref 22–32)
Calcium: 8.9 mg/dL (ref 8.9–10.3)
Chloride: 106 mmol/L (ref 98–111)
Creatinine, Ser: 3.79 mg/dL — ABNORMAL HIGH (ref 0.61–1.24)
GFR, Estimated: 15 mL/min — ABNORMAL LOW (ref >60)
Glucose, Bld: 150 mg/dL — ABNORMAL HIGH (ref 70–99)
Potassium: 4.4 mmol/L (ref 3.5–5.1)
Sodium: 137 mmol/L (ref 135–145)
Total Bilirubin: 0.5 mg/dL (ref 0.3–1.2)
Total Protein: 7.4 g/dL (ref 6.5–8.1)

## 2020-08-20 LAB — RETICULOCYTES
Immature Retic Fract: 15.5 % (ref 2.3–15.9)
RBC.: 2.5 MIL/uL — ABNORMAL LOW (ref 4.22–5.81)
Retic Count, Absolute: 52.5 10*3/uL (ref 19.0–186.0)
Retic Ct Pct: 2.1 % (ref 0.4–3.1)

## 2020-08-20 LAB — CBC WITH DIFFERENTIAL/PLATELET
Abs Immature Granulocytes: 0.04 10*3/uL (ref 0.00–0.07)
Basophils Absolute: 0.1 10*3/uL (ref 0.0–0.1)
Basophils Relative: 1 %
Eosinophils Absolute: 0.1 10*3/uL (ref 0.0–0.5)
Eosinophils Relative: 1 %
HCT: 25 % — ABNORMAL LOW (ref 39.0–52.0)
Hemoglobin: 8 g/dL — ABNORMAL LOW (ref 13.0–17.0)
Immature Granulocytes: 0 %
Lymphocytes Relative: 4 %
Lymphs Abs: 0.4 10*3/uL — ABNORMAL LOW (ref 0.7–4.0)
MCH: 30.2 pg (ref 26.0–34.0)
MCHC: 32 g/dL (ref 30.0–36.0)
MCV: 94.3 fL (ref 80.0–100.0)
Monocytes Absolute: 0.6 10*3/uL (ref 0.1–1.0)
Monocytes Relative: 6 %
Neutro Abs: 9.3 10*3/uL — ABNORMAL HIGH (ref 1.7–7.7)
Neutrophils Relative %: 88 %
Platelets: 328 10*3/uL (ref 150–400)
RBC: 2.65 MIL/uL — ABNORMAL LOW (ref 4.22–5.81)
RDW: 15 % (ref 11.5–15.5)
WBC: 10.5 10*3/uL (ref 4.0–10.5)
nRBC: 0 % (ref 0.0–0.2)

## 2020-08-20 LAB — IRON AND TIBC
Iron: 27 ug/dL — ABNORMAL LOW (ref 45–182)
Saturation Ratios: 6 % — ABNORMAL LOW (ref 17.9–39.5)
TIBC: 434 ug/dL (ref 250–450)
UIBC: 407 ug/dL

## 2020-08-20 LAB — LACTATE DEHYDROGENASE: LDH: 170 U/L (ref 98–192)

## 2020-08-20 LAB — SAMPLE TO BLOOD BANK

## 2020-08-20 LAB — FERRITIN: Ferritin: 41 ng/mL (ref 24–336)

## 2020-08-20 LAB — TSH: TSH: 5.516 u[IU]/mL — ABNORMAL HIGH (ref 0.350–4.500)

## 2020-08-20 LAB — VITAMIN B12: Vitamin B-12: 839 pg/mL (ref 180–914)

## 2020-08-20 LAB — FOLATE: Folate: 13.3 ng/mL (ref >5.9)

## 2020-08-20 MED ORDER — EPOETIN ALFA-EPBX 40000 UNIT/ML IJ SOLN
40000.0000 [IU] | INTRAMUSCULAR | Status: AC
  Administered 2020-08-20: 40000 [IU] via SUBCUTANEOUS
  Filled 2020-08-20: qty 1

## 2020-08-21 ENCOUNTER — Encounter: Payer: Self-pay | Admitting: Oncology

## 2020-08-21 LAB — KAPPA/LAMBDA LIGHT CHAINS
Kappa free light chain: 54.3 mg/L — ABNORMAL HIGH (ref 3.3–19.4)
Kappa, lambda light chain ratio: 1.16 (ref 0.26–1.65)
Lambda free light chains: 47 mg/L — ABNORMAL HIGH (ref 5.7–26.3)

## 2020-08-21 LAB — HAPTOGLOBIN: Haptoglobin: 282 mg/dL (ref 38–329)

## 2020-08-21 NOTE — Progress Notes (Signed)
Hematology/Oncology Consult note [facility]  [phone] [phone]  Patient Care Team: Rusty Aus, MD as PCP - General (Internal Medicine)   Name of the patient: Eddie Mullen  [id number]  05-31-1933   Date of visit: 08/21/20  Diagnosis-anemia secondary to chronic kidney disease and iron deficiency  Chief complaint/ Reason for visit-routine follow-up of anemia and post hospital discharge follow-up  Heme/Onc history: Patient is a 87 year old male with a past medical history significant for stage III CKD hypertension hyperlipidemia and Barrett's esophagus among other medical problems. Patient's most recent CBC showed hemoglobin of 9.7. His white cell count and platelets have been normal. Prior to that patient's hemoglobin was mostly between 11-12 up until February 2020 and since then there has been a slow decline. Patient's baseline creatinine is between 3-4. Most recent ferritin on 10/03/2019 was 31. TSH in January 2021 was normal at 3.9. B12 levels in January were 374.Patient started taking oral iron since early March 2021  Results of blood work from 11/25/2019 were as follows: CBC showed a white cell count of 6.3, H&H of 9.8/28.6 and a normal platelet count. Ferritin levels were low at 23. Iron studies were normal. Myeloma panel did not show any M protein. Serum free light chains both kappa and lambda were elevated with a normal free light chain ratio 1.3. B12 levels normal at 383 and folate was normal.    Interval history- Patient was recently hospitalized for symptoms of chest pain and was found to have NSTEMI.  Medical management was recommended.  His hemoglobin drifted down to 6.9 during hospitalization he received 1 unit of PRBC transfusion.  He was also treated for congestive heart failure exacerbation.  Patient does report ongoing fatigue.  ECOG PS- 2 Pain scale- 2 Opioid associated constipation- no  Review of  systems- Review of Systems  Constitutional: Positive for malaise/fatigue. Negative for chills, fever and weight loss.  HENT: Negative for congestion, ear discharge and nosebleeds.   Eyes: Negative for blurred vision.  Respiratory: Negative for cough, hemoptysis, sputum production, shortness of breath and wheezing.   Cardiovascular: Negative for chest pain, palpitations, orthopnea and claudication.  Gastrointestinal: Negative for abdominal pain, blood in stool, constipation, diarrhea, heartburn, melena, nausea and vomiting.  Genitourinary: Negative for dysuria, flank pain, frequency, hematuria and urgency.  Musculoskeletal: Negative for back pain, joint pain and myalgias.  Skin: Negative for rash.  Neurological: Negative for dizziness, tingling, focal weakness, seizures, weakness and headaches.  Endo/Heme/Allergies: Does not bruise/bleed easily.  Psychiatric/Behavioral: Negative for depression and suicidal ideas. The patient does not have insomnia.       Allergies  Allergen Reactions  . Isosorbide     Other reaction(s): Dizziness  . Other Anaphylaxis    Stinging insects-any  . Ace Inhibitors     Other reaction(s): Unknown Proteinuria  . Penicillin G Rash    Other reaction(s): Headache  . Penicillins Rash     Past Medical History:  Diagnosis Date  . CAD (coronary artery disease)   . Hypertension      Past Surgical History:  Procedure Laterality Date  . CORONARY ANGIOPLASTY WITH STENT PLACEMENT    . HERNIA REPAIR      Social History   Socioeconomic History  . Marital status: Married    Spouse name: Not on file  . Number of children: Not on file  . Years of education: Not on file  . Highest education level: Not on file  Occupational History  . Not  on file  Tobacco Use  . Smoking status: Never Smoker  . Smokeless tobacco: Never Used  Substance and Sexual Activity  . Alcohol use: Yes  . Drug use: Never  . Sexual activity: Not on file  Other Topics Concern  .  Not on file  Social History Narrative  . Not on file   Social Determinants of Health   Financial Resource Strain: Not on file  Food Insecurity: Not on file  Transportation Needs: Not on file  Physical Activity: Not on file  Stress: Not on file  Social Connections: Not on file  Intimate Partner Violence: Not on file    Family History  Problem Relation Age of Onset  . CVA Mother   . CAD Mother      Current Outpatient Medications:  .  aspirin EC 81 MG EC tablet, Take 1 tablet (81 mg total) by mouth daily. Swallow whole., Disp: , Rfl: 11 .  atorvastatin (LIPITOR) 40 MG tablet, Take 1 tablet (40 mg total) by mouth daily., Disp: 30 tablet, Rfl: 2 .  isosorbide mononitrate (IMDUR) 30 MG 24 hr tablet, Take 1 tablet by mouth daily., Disp: , Rfl:  .  metoprolol succinate (TOPROL-XL) 50 MG 24 hr tablet, Take 75 mg by mouth daily., Disp: , Rfl:  .  nitroGLYCERIN (NITROSTAT) 0.4 MG SL tablet, Place 1 tablet under the tongue., Disp: , Rfl:  .  pantoprazole (PROTONIX) 40 MG tablet, Take by mouth., Disp: , Rfl:  .  phenytoin (DILANTIN) 100 MG ER capsule, 2 capsules (200 mg total) nightly, Disp: , Rfl:  .  torsemide (DEMADEX) 20 MG tablet, Take 1 tablet (20 mg total) by mouth daily as needed (for swelling)., Disp: 30 tablet, Rfl: 0 .  zolpidem (AMBIEN) 10 MG tablet, Take 10 mg by mouth at bedtime as needed for sleep. Take 1/2 tablet or whole tablet at bedtime, Disp: , Rfl:  No current facility-administered medications for this visit.  Facility-Administered Medications Ordered in Other Visits:  .  epoetin alfa-epbx (RETACRIT) injection 40,000 Units, 40,000 Units, Subcutaneous, Q28 days, Sindy Guadeloupe, MD, 40,000 Units at 08/20/20 1522  Physical exam:  Vitals:   08/20/20 1423  BP: (!) 143/90  Pulse: 74  Resp: 18  Temp: (!) 95.9 F (35.5 C)  TempSrc: Tympanic  SpO2: 100%  Weight: 136 lb (61.7 kg)   Physical Exam Constitutional:      Comments: Elderly gentleman sitting in a wheelchair.   Appears fatigued  Eyes:     Extraocular Movements: EOM normal.  Cardiovascular:     Rate and Rhythm: Normal rate and regular rhythm.     Heart sounds: Normal heart sounds.  Pulmonary:     Effort: Pulmonary effort is normal.     Breath sounds: Normal breath sounds.  Abdominal:     General: Bowel sounds are normal.     Palpations: Abdomen is soft.  Skin:    General: Skin is warm and dry.  Neurological:     Mental Status: He is alert and oriented to person, place, and time.      CMP Latest Ref Rng & Units 08/20/2020  Glucose 70 - 99 mg/dL 150(H)  BUN 8 - 23 mg/dL 87(H)  Creatinine 0.61 - 1.24 mg/dL 3.79(H)  Sodium 135 - 145 mmol/L 137  Potassium 3.5 - 5.1 mmol/L 4.4  Chloride 98 - 111 mmol/L 106  CO2 22 - 32 mmol/L 20(L)  Calcium 8.9 - 10.3 mg/dL 8.9  Total Protein 6.5 - 8.1 g/dL 7.4  Total Bilirubin 0.3 - 1.2 mg/dL 0.5  Alkaline Phos 38 - 126 U/L 60  AST 15 - 41 U/L 20  ALT 0 - 44 U/L 29   CBC Latest Ref Rng & Units 08/20/2020  WBC 4.0 - 10.5 K/uL 10.5  Hemoglobin 13.0 - 17.0 g/dL 8.0(L)  Hematocrit 39.0 - 52.0 % 25.0(L)  Platelets 150 - 400 K/uL 328    No images are attached to the encounter.  US RENAL  Result Date: 08/02/2020 CLINICAL DATA:  Acute renal failure EXAM: RENAL / URINARY TRACT ULTRASOUND COMPLETE COMPARISON:  None. FINDINGS: Right Kidney: Renal measurements: 7.8 x 4.1 x 3.9 cm = volume: 65 mL. Cortex is echogenic and thinned. Cortex is heterogeneous but without discrete mass. Probable 9 mm nonobstructing stone. No hydronephrosis. Left Kidney: Renal measurements: 9.6 x 4.4 x 4.3 cm = volume: 95 mL. Cortex is echogenic and thinned. Cortex is heterogeneous but without discrete mass. No hydronephrosis. Bladder: Appears normal for degree of bladder distention. Other: Bilateral pleural effusions. IMPRESSION: 1. Both kidneys are small and echogenic with cortical thinning indicating chronic medical renal disease. No hydronephrosis. 2. Probable nonobstructing 9 mm RIGHT  renal stone. 3. Bilateral pleural effusions. Electronically Signed   By: Franki Cabot M.D.   On: 08/02/2020 10:59   DG Chest Portable 1 View  Result Date: 07/31/2020 CLINICAL DATA:  Chest pain and dyspnea. EXAM: PORTABLE CHEST 1 VIEW COMPARISON:  Radiograph 03/23/2018 FINDINGS: Borderline cardiomegaly. Diffuse interstitial and septal thickening typical of pulmonary edema. Small bilateral pleural effusions, left greater than right. Previous hiatal hernia is not well seen. No confluent consolidation. No pneumothorax. Chronic change of both shoulders. IMPRESSION: Pulmonary edema and small pleural effusions consistent with CHF. Electronically Signed   By: Keith Rake M.D.   On: 07/31/2020 22:27   ECHOCARDIOGRAM COMPLETE  Result Date: 08/02/2020    ECHOCARDIOGRAM REPORT   Patient Name:   SAMAD MCCASKEY Date of Exam: 08/01/2020 Medical Rec #:  [id number]        Height:       67.0 in Accession #:    [id number]       Weight:       135.0 lb Date of Birth:  01-15-1933         BSA:          41.711 m Patient Age:    87 years         BP:           152/96 mmHg Patient Gender: M                HR:           109 bpm. Exam Location:  [facility] Procedure: 2D Echo Indications:     CHF-ACUTE DIASTOLIC [id number]  History:         Patient has no prior history of Echocardiogram examinations.                  CHF, Acute MI, Signs/Symptoms:Chest Pain; Risk                  Factors:Hypertension.  Sonographer:     Avanell Shackleton Referring Phys:  [id number] Athena Masse Diagnosing Phys: Isaias Cowman MD IMPRESSIONS  1. Left ventricular ejection fraction, by estimation, is 35 to 40%. The left ventricle has moderately decreased function. The left ventricle demonstrates regional wall motion abnormalities (see scoring diagram/findings for description). Left ventricular  diastolic parameters were normal.  2. Right ventricular systolic function is normal.  The right ventricular size is normal.  3. The mitral valve is normal in structure. Mild  to moderate mitral valve regurgitation. No evidence of mitral stenosis.  4. The aortic valve is normal in structure. Aortic valve regurgitation is trivial. No aortic stenosis is present.  5. The inferior vena cava is normal in size with greater than 50% respiratory variability, suggesting right atrial pressure of 3 mmHg. FINDINGS  Left Ventricle: Left ventricular ejection fraction, by estimation, is 35 to 40%. The left ventricle has moderately decreased function. The left ventricle demonstrates regional wall motion abnormalities. The left ventricular internal cavity size was normal in size. There is no left ventricular hypertrophy. Left ventricular diastolic parameters were normal. Right Ventricle: The right ventricular size is normal. No increase in right ventricular wall thickness. Right ventricular systolic function is normal. Left Atrium: Left atrial size was normal in size. Right Atrium: Right atrial size was normal in size. Pericardium: There is no evidence of pericardial effusion. Mitral Valve: The mitral valve is normal in structure. Mild to moderate mitral valve regurgitation. No evidence of mitral valve stenosis. Tricuspid Valve: The tricuspid valve is normal in structure. Tricuspid valve regurgitation is mild . No evidence of tricuspid stenosis. Aortic Valve: The aortic valve is normal in structure. Aortic valve regurgitation is trivial. No aortic stenosis is present. Pulmonic Valve: The pulmonic valve was normal in structure. Pulmonic valve regurgitation is not visualized. No evidence of pulmonic stenosis. Aorta: The aortic root is normal in size and structure. Venous: The inferior vena cava is normal in size with greater than 50% respiratory variability, suggesting right atrial pressure of 3 mmHg. IAS/Shunts: No atrial level shunt detected by color flow Doppler.  LEFT VENTRICLE PLAX 2D LVIDd:         4.63 cm LVIDs:         3.96 cm LV PW:         1.10 cm LV IVS:        1.40 cm LVOT diam:     2.00 cm LVOT  Area:     3.14 cm  LV Volumes (MOD) LV vol d, MOD A2C: 179.0 ml LV vol d, MOD A4C: 167.0 ml LV vol s, MOD A2C: 140.0 ml LV vol s, MOD A4C: 120.0 ml LV SV MOD A2C:     39.0 ml LV SV MOD A4C:     167.0 ml LV SV MOD BP:      40.3 ml RIGHT VENTRICLE         IVC TAPSE (M-mode): 2.0 cm  IVC diam: 2.16 cm LEFT ATRIUM              Index       RIGHT ATRIUM           Index LA diam:        5.10 cm  2.98 cm/m  RA Area:     15.50 cm LA Vol (A2C):   102.0 ml 59.61 ml/m RA Volume:   42.90 ml  25.07 ml/m LA Vol (A4C):   89.8 ml  52.48 ml/m LA Biplane Vol: 94.7 ml  55.34 ml/m   AORTA Ao Root diam: 3.60 cm MITRAL VALVE                TRICUSPID VALVE MV Area (PHT): 5.84 cm     TR Peak grad:   58.1 mmHg MV Decel Time: 130 msec     TR Vmax:        381.00 cm/s MR Peak grad: 131.0  mmHg MR Mean grad: 89.3 mmHg     SHUNTS MR Vmax:      572.33 cm/s   Systemic Diam: 2.00 cm MR Vmean:     445.3 cm/s MV E velocity: 103.00 cm/s MV A velocity: 47.70 cm/s MV E/A ratio:  2.16 Isaias Cowman MD Electronically signed by Isaias Cowman MD Signature Date/Time: 08/02/2020/10:51:20 AM    Final      Assessment and plan- Patient is a 87 y.o. male with chronic normocytic anemia likely secondary to anemia of chronic kidney disease as well as iron deficiency  Patient's hemoglobin is drifted down to 6.9 during his hospitalization and he received 1 unit of PRBC transfusion.  His hemoglobin is presently, up to 8.0.  B12 and folate levels are normal.  Iron studies show a low ferritin of 41.  TIBC at the upper limit of normal at 434 with an iron saturation of 6%.  I will therefore given 2 doses of Feraheme as well as initiated Retacrit at this time.  Discussed risks and benefits of Feraheme including all but not limited to possible risk of infusion reaction.  Patient understands and agrees to proceed as planned.  We will plan to give him 40,000 units of Retacrit every 3 weeks to keep his hemoglobin between 10 and 11.  Discussed risks and  benefits of Retacrit including all but not limited to possible risk of thromboembolic side effects.  Patient understands and agrees to proceed as planned.  I will see him back in 3 months with CBC ferritin and iron studies   Visit Diagnosis 1. Iron deficiency anemia, unspecified iron deficiency anemia type   2. Anemia of chronic kidney failure, unspecified stage      Dr. Randa Evens, MD, MPH [facility] [id number] 08/21/2020 1:47 PM

## 2020-08-24 ENCOUNTER — Ambulatory Visit: Payer: Medicare Other | Admitting: Oncology

## 2020-08-24 ENCOUNTER — Other Ambulatory Visit: Payer: Medicare Other

## 2020-08-24 LAB — MULTIPLE MYELOMA PANEL, SERUM
Albumin SerPl Elph-Mcnc: 3.6 g/dL (ref 2.9–4.4)
Albumin/Glob SerPl: 1.3 (ref 0.7–1.7)
Alpha 1: 0.3 g/dL (ref 0.0–0.4)
Alpha2 Glob SerPl Elph-Mcnc: 1.2 g/dL — ABNORMAL HIGH (ref 0.4–1.0)
B-Globulin SerPl Elph-Mcnc: 1 g/dL (ref 0.7–1.3)
Gamma Glob SerPl Elph-Mcnc: 0.5 g/dL (ref 0.4–1.8)
Globulin, Total: 3 g/dL (ref 2.2–3.9)
IgA: 156 mg/dL (ref 61–437)
IgG (Immunoglobin G), Serum: 522 mg/dL — ABNORMAL LOW (ref 603–1613)
IgM (Immunoglobulin M), Srm: 22 mg/dL (ref 15–143)
Total Protein ELP: 6.6 g/dL (ref 6.0–8.5)

## 2020-08-28 ENCOUNTER — Inpatient Hospital Stay: Payer: Medicare Other | Attending: Oncology

## 2020-08-28 VITALS — BP 151/91 | HR 97 | Temp 96.9°F | Resp 18

## 2020-08-28 DIAGNOSIS — D631 Anemia in chronic kidney disease: Secondary | ICD-10-CM | POA: Diagnosis present

## 2020-08-28 DIAGNOSIS — D509 Iron deficiency anemia, unspecified: Secondary | ICD-10-CM

## 2020-08-28 DIAGNOSIS — N189 Chronic kidney disease, unspecified: Secondary | ICD-10-CM | POA: Insufficient documentation

## 2020-08-28 MED ORDER — SODIUM CHLORIDE 0.9 % IV SOLN
Freq: Once | INTRAVENOUS | Status: AC
  Filled 2020-08-28: qty 250

## 2020-08-28 MED ORDER — SODIUM CHLORIDE 0.9 % IV SOLN
510.0000 mg | Freq: Once | INTRAVENOUS | Status: AC
  Administered 2020-08-28: 510 mg via INTRAVENOUS
  Filled 2020-08-28: qty 510

## 2020-08-28 NOTE — Progress Notes (Signed)
Patient received prescribed treatment in clinic. IV Feraheme. Tolerated well. Patient stable at discharge.

## 2020-09-04 ENCOUNTER — Inpatient Hospital Stay: Payer: Medicare Other

## 2020-09-04 VITALS — BP 144/86 | HR 96

## 2020-09-04 DIAGNOSIS — N189 Chronic kidney disease, unspecified: Secondary | ICD-10-CM | POA: Diagnosis not present

## 2020-09-04 DIAGNOSIS — D509 Iron deficiency anemia, unspecified: Secondary | ICD-10-CM

## 2020-09-04 MED ORDER — SODIUM CHLORIDE 0.9 % IV SOLN
Freq: Once | INTRAVENOUS | Status: AC
  Filled 2020-09-04: qty 250

## 2020-09-04 MED ORDER — SODIUM CHLORIDE 0.9 % IV SOLN
510.0000 mg | Freq: Once | INTRAVENOUS | Status: AC
  Administered 2020-09-04: 510 mg via INTRAVENOUS
  Filled 2020-09-04: qty 510

## 2020-09-10 ENCOUNTER — Inpatient Hospital Stay: Payer: Medicare Other

## 2020-09-10 ENCOUNTER — Other Ambulatory Visit: Payer: Self-pay

## 2020-09-10 ENCOUNTER — Other Ambulatory Visit: Payer: Self-pay | Admitting: *Deleted

## 2020-09-10 VITALS — BP 133/90 | HR 98

## 2020-09-10 DIAGNOSIS — N189 Chronic kidney disease, unspecified: Secondary | ICD-10-CM | POA: Diagnosis not present

## 2020-09-10 DIAGNOSIS — D509 Iron deficiency anemia, unspecified: Secondary | ICD-10-CM

## 2020-09-10 DIAGNOSIS — D631 Anemia in chronic kidney disease: Secondary | ICD-10-CM

## 2020-09-10 LAB — HEMOGLOBIN AND HEMATOCRIT, BLOOD
HCT: 28.8 % — ABNORMAL LOW (ref 39.0–52.0)
Hemoglobin: 9 g/dL — ABNORMAL LOW (ref 13.0–17.0)

## 2020-09-10 MED ORDER — EPOETIN ALFA-EPBX 40000 UNIT/ML IJ SOLN
40000.0000 [IU] | INTRAMUSCULAR | Status: DC
  Administered 2020-09-10: 40000 [IU] via SUBCUTANEOUS

## 2020-10-01 ENCOUNTER — Inpatient Hospital Stay: Payer: Medicare Other

## 2020-10-01 ENCOUNTER — Other Ambulatory Visit: Payer: Self-pay

## 2020-10-01 ENCOUNTER — Inpatient Hospital Stay: Payer: Medicare Other | Attending: Oncology

## 2020-10-01 DIAGNOSIS — D509 Iron deficiency anemia, unspecified: Secondary | ICD-10-CM

## 2020-10-01 DIAGNOSIS — D631 Anemia in chronic kidney disease: Secondary | ICD-10-CM | POA: Diagnosis present

## 2020-10-01 DIAGNOSIS — N189 Chronic kidney disease, unspecified: Secondary | ICD-10-CM | POA: Diagnosis present

## 2020-10-01 LAB — CBC WITH DIFFERENTIAL/PLATELET
Abs Immature Granulocytes: 0.03 10*3/uL (ref 0.00–0.07)
Basophils Absolute: 0.1 10*3/uL (ref 0.0–0.1)
Basophils Relative: 1 %
Eosinophils Absolute: 0.1 10*3/uL (ref 0.0–0.5)
Eosinophils Relative: 1 %
HCT: 34.8 % — ABNORMAL LOW (ref 39.0–52.0)
Hemoglobin: 11.7 g/dL — ABNORMAL LOW (ref 13.0–17.0)
Immature Granulocytes: 0 %
Lymphocytes Relative: 10 %
Lymphs Abs: 0.8 10*3/uL (ref 0.7–4.0)
MCH: 31.6 pg (ref 26.0–34.0)
MCHC: 33.6 g/dL (ref 30.0–36.0)
MCV: 94.1 fL (ref 80.0–100.0)
Monocytes Absolute: 0.7 10*3/uL (ref 0.1–1.0)
Monocytes Relative: 8 %
Neutro Abs: 6.6 10*3/uL (ref 1.7–7.7)
Neutrophils Relative %: 80 %
Platelets: 209 10*3/uL (ref 150–400)
RBC: 3.7 MIL/uL — ABNORMAL LOW (ref 4.22–5.81)
RDW: 18.6 % — ABNORMAL HIGH (ref 11.5–15.5)
WBC: 8.3 10*3/uL (ref 4.0–10.5)
nRBC: 0 % (ref 0.0–0.2)

## 2020-10-01 LAB — IRON AND TIBC
Iron: 108 ug/dL (ref 45–182)
Saturation Ratios: 39 % (ref 17.9–39.5)
TIBC: 276 ug/dL (ref 250–450)
UIBC: 168 ug/dL

## 2020-10-01 LAB — FERRITIN: Ferritin: 256 ng/mL (ref 24–336)

## 2020-10-16 ENCOUNTER — Other Ambulatory Visit: Payer: Self-pay

## 2020-10-16 DIAGNOSIS — D509 Iron deficiency anemia, unspecified: Secondary | ICD-10-CM

## 2020-10-22 ENCOUNTER — Inpatient Hospital Stay: Payer: Medicare Other

## 2020-10-22 ENCOUNTER — Other Ambulatory Visit: Payer: Self-pay

## 2020-10-22 DIAGNOSIS — D509 Iron deficiency anemia, unspecified: Secondary | ICD-10-CM

## 2020-10-22 DIAGNOSIS — N189 Chronic kidney disease, unspecified: Secondary | ICD-10-CM | POA: Diagnosis not present

## 2020-10-22 LAB — CBC WITH DIFFERENTIAL/PLATELET
Abs Immature Granulocytes: 0.02 10*3/uL (ref 0.00–0.07)
Basophils Absolute: 0.1 10*3/uL (ref 0.0–0.1)
Basophils Relative: 1 %
Eosinophils Absolute: 0.1 10*3/uL (ref 0.0–0.5)
Eosinophils Relative: 1 %
HCT: 33.9 % — ABNORMAL LOW (ref 39.0–52.0)
Hemoglobin: 11.5 g/dL — ABNORMAL LOW (ref 13.0–17.0)
Immature Granulocytes: 0 %
Lymphocytes Relative: 11 %
Lymphs Abs: 0.9 10*3/uL (ref 0.7–4.0)
MCH: 31.1 pg (ref 26.0–34.0)
MCHC: 33.9 g/dL (ref 30.0–36.0)
MCV: 91.6 fL (ref 80.0–100.0)
Monocytes Absolute: 0.8 10*3/uL (ref 0.1–1.0)
Monocytes Relative: 10 %
Neutro Abs: 6.3 10*3/uL (ref 1.7–7.7)
Neutrophils Relative %: 77 %
Platelets: 176 10*3/uL (ref 150–400)
RBC: 3.7 MIL/uL — ABNORMAL LOW (ref 4.22–5.81)
RDW: 16.7 % — ABNORMAL HIGH (ref 11.5–15.5)
WBC: 8.1 10*3/uL (ref 4.0–10.5)
nRBC: 0 % (ref 0.0–0.2)

## 2020-10-22 LAB — IRON AND TIBC
Iron: 164 ug/dL (ref 45–182)
Saturation Ratios: 68 % — ABNORMAL HIGH (ref 17.9–39.5)
TIBC: 242 ug/dL — ABNORMAL LOW (ref 250–450)
UIBC: 78 ug/dL

## 2020-10-22 LAB — FERRITIN: Ferritin: 189 ng/mL (ref 24–336)

## 2020-10-26 ENCOUNTER — Telehealth: Payer: Self-pay | Admitting: Oncology

## 2020-10-26 NOTE — Telephone Encounter (Signed)
Patient called to cancel the following appointments:  10/29/20 Lab/MD, 4/21 Lab/Inj and 4/28 Lab/MD.  Patient states that he will call back to reschedule when it is more convinent for him.

## 2020-10-29 ENCOUNTER — Inpatient Hospital Stay: Payer: Medicare Other | Admitting: Oncology

## 2020-10-29 ENCOUNTER — Inpatient Hospital Stay: Payer: Medicare Other

## 2020-11-12 ENCOUNTER — Other Ambulatory Visit: Payer: Medicare Other

## 2020-11-12 ENCOUNTER — Ambulatory Visit: Payer: Medicare Other

## 2020-11-19 ENCOUNTER — Other Ambulatory Visit: Payer: Medicare Other

## 2020-11-19 ENCOUNTER — Ambulatory Visit: Payer: Medicare Other | Admitting: Oncology

## 2020-11-25 ENCOUNTER — Other Ambulatory Visit: Payer: Self-pay | Admitting: Internal Medicine

## 2020-11-25 DIAGNOSIS — S065XAA Traumatic subdural hemorrhage with loss of consciousness status unknown, initial encounter: Secondary | ICD-10-CM

## 2020-11-25 DIAGNOSIS — S065X9A Traumatic subdural hemorrhage with loss of consciousness of unspecified duration, initial encounter: Secondary | ICD-10-CM

## 2020-12-11 ENCOUNTER — Ambulatory Visit
Admission: RE | Admit: 2020-12-11 | Discharge: 2020-12-11 | Disposition: A | Discharge status: Home / Self Care | Payer: Medicare Other | Source: Ambulatory Visit | Attending: Internal Medicine | Admitting: Internal Medicine

## 2020-12-11 ENCOUNTER — Other Ambulatory Visit: Payer: Self-pay

## 2020-12-11 DIAGNOSIS — S065XAA Traumatic subdural hemorrhage with loss of consciousness status unknown, initial encounter: Secondary | ICD-10-CM

## 2020-12-11 DIAGNOSIS — S065X9A Traumatic subdural hemorrhage with loss of consciousness of unspecified duration, initial encounter: Secondary | ICD-10-CM | POA: Diagnosis not present

## 2021-06-25 ENCOUNTER — Telehealth: Payer: Self-pay | Admitting: Nurse Practitioner

## 2021-06-25 NOTE — Telephone Encounter (Signed)
Spoke with patient's wife Joycelyn Schmid, regarding the Palliative referral/services and all questions were answered and she was in agreement with scheduling visit.  I have scheduled an In-home Consult for 07/29/21 at 12:30 PM

## 2021-07-28 ENCOUNTER — Encounter: Payer: Self-pay | Admitting: Oncology

## 2021-07-29 ENCOUNTER — Other Ambulatory Visit: Payer: Self-pay

## 2021-07-29 ENCOUNTER — Encounter: Payer: Self-pay | Admitting: Nurse Practitioner

## 2021-07-29 ENCOUNTER — Other Ambulatory Visit: Payer: Medicare Other | Admitting: Nurse Practitioner

## 2021-07-29 DIAGNOSIS — R531 Weakness: Secondary | ICD-10-CM

## 2021-07-29 DIAGNOSIS — Z515 Encounter for palliative care: Secondary | ICD-10-CM

## 2021-07-29 DIAGNOSIS — R630 Anorexia: Secondary | ICD-10-CM

## 2021-07-29 DIAGNOSIS — N184 Chronic kidney disease, stage 4 (severe): Secondary | ICD-10-CM

## 2021-07-29 NOTE — Progress Notes (Signed)
[facility] Palliative Care Consult Note Telephone: [phone]  Fax: [phone]   Date of encounter: 07/29/21 5:24 PM PATIENT NAME: Eddie Mullen [place] [address]   [phone] (home)  DOB: 1933-07-09 MRN: [id number] PRIMARY CARE PROVIDER:    Rusty Aus, MD,  [place] [address] [phone]  REFERRING PROVIDER:   Rusty Aus, MD [place],  [place] [address] [phone]  RESPONSIBLE PARTY:    Contact Information     Name Relation Home Work Mobile   ASHOK, SAWAYA   [phone]      I met face to face with patient and family in home. Palliative Care was asked to follow this patient by consultation request of  Rusty Aus, MD to address advance care planning and complex medical decision making. This is the initial visit.                             ASSESSMENT AND PLAN / RECOMMENDATIONS:  Advance Care Planning/Goals of Care: Goals include to maximize quality of life and symptom management. Patient/health care surrogate gave his/her permission to discuss.Our advance care planning conversation included a discussion about:    The value and importance of advance care planning  Experiences with loved ones who have been seriously ill or have died  Exploration of personal, cultural or spiritual beliefs that might influence medical decisions  Exploration of goals of care in the event of a sudden injury or illness  Identification  of a healthcare agent  Review and updating or creation of an  advance directive document . Decision not to resuscitate or to de-escalate disease focused treatments due to poor prognosis. CODE STATUS: DNR  Symptom Management/Plan: 1. Advance Care Planning; Discussed and made DNR, golden rod form completed, placed in vynca.  2. Anorexia with weight loss; discussed  nutrition, nephro supplements. We talked about weights; His typical weight ran around 170's.   08/20/2020 weight 136 lbs 05/26/2021 weight 129 lbs Weight around 122 lbs  Creatinine in 05/2021 from 4.1 up to 4.8 with GFR 13  3. Generalized weakness secondary to CKD; discussed at length about energy conservation; importance of sleep hygiene, nutrition, fall precautions. We talked about disease progression  4. Goals of Care: Goals include to maximize quality of life and symptom management. Our advance care planning conversation included a discussion about:    The value and importance of advance care planning  Exploration of personal, cultural or spiritual beliefs that might influence medical decisions  Exploration of goals of care in the event of a sudden injury or illness  Identification and preparation of a healthcare agent  Review and updating or creation of an advance directive document.  5. Palliative care encounter; Palliative care encounter; Palliative medicine team will continue to support patient, patient's family, and medical team. Visit consisted of counseling and education dealing with the complex and emotionally intense issues of symptom management and palliative care in the setting of serious and potentially life-threatening illness  6. f/u 1 month for ongoing monitoring chronic disease progression, ongoing discussions complex medical decision making  Follow up Palliative Care Visit: Palliative care will continue to follow for complex medical decision making, advance care planning, and clarification of goals. Return 4 weeks or prn.  I spent 78 minutes providing this consultation. More than 50% of the time in  this consultation was spent in counseling and care coordination  PPS: 40%  HOSPICE ELIGIBILITY/DIAGNOSIS: Will have [facility] review case for eligibility   Chief Complaint: Initial Palliative consult for complex medical decision making  HISTORY OF PRESENT ILLNESS:   Eddie Mullen is a 88 y.o. year old male  with multiple medical problems including CKD, CAD s/p agnioplasty with stent, NSTEMI, Barretts esophagus, HTN, HLD, anemia of chronic disease, IDA, GERD. I called Eddie. Mullen to confirm PC initial visit and covid screening negative. I visited Eddie. and Eddie Waddington in their home. Eddie Mullen was taking slow shuffling steps in the direction of the dining area. Eddie Mullen was able to turn around and take steps to a chair in the living room. We talked about purpose pc visit. We talked about how Eddie Mullen has been feeling. Eddie Mullen endorses he is tired. We talked about symptoms of feeling fatigue, exhaustion at times with intermit shortness of breath. Noted BLE edema. Eddie Mullen talked about edema which does go down at times but recently has been fairly swollen. We talked about keeping his legs elevated as much as possible. We talked about past medical history, chronic disease progression of CKD. We talked about life review, working and retired from [employer]. We talked about family dynamics. Eddie Mullen does have a daughter from previous marriage, son-in-law and grandchildren. Eddie. and Eddie Mullen have been married over 39 years. We talked about current functional ability. Eddie. Shein is able to get out of a chair if he holds onto something. Eddie. Torrance endorses he is able to take steps though has to take rest breaks. Eddie Mullen endorses he is only able to take about 5 to 8 steps prior to sitting down. Eddie Mullen endorses Eddie Mullen sleeps about 18 hours a day, naps. We talked about cognitive changes. Eddie Mullen endorses Eddie Mullen has been irritable, he is no longer the person he once was. Currently taking seroquel. We talked about quality of life. Eddie Mullen endorses his life is not like he imagined it to be with CKD. We talked about options he was given with CKD. Eddie Mullen was adamant about no dialysis of any kind. Eddie Mullen endorses he feels like it is no  quality of life, suffering. We talked about CKD, progression what that looks like. We talked about symptoms, management. We talked about option of Hospice services through Medicare benefit. Discussed what services are provided. Eddie Mullen were in agreement to having [facility] review case. We talked about code status, including wishes for DNR. Eddie Basaldua endorses they do have a living will but needs to be notarize. We talked about PC SW involvement should Eddie. Felmlee not meet eligibility. We talked about if Hospice eligible and wishes are to proceed, Hospice SW can help with living will. Goldenrod form completed. We talked about caregiver stress, fatigue, with coping strategies. We talked about Eddie. Doucet daily routine, things he likes to do previously when he was a very active person. We talked about how things have dramatically changed in his life now. We talked about role pc in poc. Discussed and in agreement to schedule f/u pc visit which can cancel should decide on Hospice. Therapeutic listening, emotional support provided. Questions answered.   History obtained from review of EMR, discussion with Eddie and Eddie. Pennings.  I reviewed available labs, medications, imaging, studies and related documents from the EMR.  Records reviewed and summarized above.   ROS Reviewed 10 point system all  negative except in HPI  Physical Exam: Constitutional: NAD General: frail appearing, thin, chronically ill male EYES: lids intact ENMT: oral mucous membranes moist CV: S1S2, RRR, no LE edema Pulmonary: LCTA, no increased work of breathing, no cough, room air Abdomen: soft and non tender MSK: ambulatory; muscle wasting Skin: warm and dry Neuro:  + generalized weakness Psych: non-anxious affect, A and O x 3  CURRENT PROBLEM LIST:  Patient Active Problem List   Diagnosis Date Noted   Barrett's esophagus with esophagitis 08/20/2020   GERD (gastroesophageal reflux disease) 08/20/2020   Acute  on chronic systolic CHF (congestive heart failure), NYHA class 2 ([facility]) 08/07/2020   History of subdural hematoma 08/01/2020   Acute CHF (congestive heart failure) ([facility]) 08/01/2020   NSTEMI (non-ST elevated myocardial infarction) ([facility]) 08/01/2020   Hyponatremia 08/01/2020   Subdural hematoma 07/30/2020   SDH (subdural hematoma) 06/05/2020   Iron deficiency anemia 10/14/2019   Anemia in stage 4 chronic kidney disease (HCC) 10/03/2019   Aortic atherosclerosis ([facility]) 07/30/2019   Chest pain 03/23/2018   Medicare annual wellness visit, initial 07/07/2016   Hyperlipidemia, mixed 12/25/2015   Microalbuminuria 12/25/2015   CKD (chronic kidney disease) stage 4, GFR 15-29 ml/min (HCC) 12/11/2014   2-vessel coronary artery disease 05/10/2011   Essential hypertension 05/10/2011   History of peptic ulcer disease 05/10/2011   PAST MEDICAL HISTORY:  Active Ambulatory Problems    Diagnosis Date Noted   Chest pain 03/23/2018   Iron deficiency anemia 10/14/2019   SDH (subdural hematoma) 06/05/2020   Anemia in stage 4 chronic kidney disease ([facility]) 10/03/2019   2-vessel coronary artery disease 05/10/2011   CKD (chronic kidney disease) stage 4, GFR 15-29 ml/min ([facility]) 12/11/2014   Essential hypertension 05/10/2011   History of peptic ulcer disease 05/10/2011   History of subdural hematoma 08/01/2020   Acute CHF (congestive heart failure) ([facility]) 08/01/2020   NSTEMI (non-ST elevated myocardial infarction) ([facility]) 08/01/2020   Hyponatremia 08/01/2020   Acute on chronic systolic CHF (congestive heart failure), NYHA class 2 ([facility]) 08/07/2020   Aortic atherosclerosis ([facility]) 07/30/2019   Barrett's esophagus with esophagitis 08/20/2020   GERD (gastroesophageal reflux disease) 08/20/2020   Hyperlipidemia, mixed 12/25/2015   Medicare annual wellness visit, initial 07/07/2016   Microalbuminuria 12/25/2015   Subdural hematoma 07/30/2020   Resolved Ambulatory Problems    Diagnosis Date Noted   No Resolved  Ambulatory Problems   Past Medical History:  Diagnosis Date   CAD (coronary artery disease)    Hypertension    SOCIAL HX:  Social History   Tobacco Use   Smoking status: Never   Smokeless tobacco: Never  Substance Use Topics   Alcohol use: Yes   FAMILY HX:  Family History  Problem Relation Age of Onset   CVA Mother    CAD Mother     reviewed  ALLERGIES:  Allergies  Allergen Reactions   Isosorbide     Other reaction(s): Dizziness   Other Anaphylaxis    Stinging insects-any   Ace Inhibitors     Other reaction(s): Unknown Proteinuria   Penicillin G Rash    Other reaction(s): Headache   Penicillins Rash     PERTINENT MEDICATIONS:  Outpatient Encounter Medications as of 07/29/2021  Medication Sig   aspirin EC 81 MG EC tablet Take 1 tablet (81 mg total) by mouth daily. Swallow whole.   atorvastatin (LIPITOR) 40 MG tablet Take 1 tablet (40 mg total) by mouth daily.   isosorbide mononitrate (IMDUR) 30 MG 24 hr tablet Take  1 tablet by mouth daily.   metoprolol succinate (TOPROL-XL) 50 MG 24 hr tablet Take 75 mg by mouth daily.   nitroGLYCERIN (NITROSTAT) 0.4 MG SL tablet Place 1 tablet under the tongue.   pantoprazole (PROTONIX) 40 MG tablet Take by mouth.   phenytoin (DILANTIN) 100 MG ER capsule 2 capsules (200 mg total) nightly   torsemide (DEMADEX) 20 MG tablet Take 1 tablet (20 mg total) by mouth daily as needed (for swelling).   zolpidem (AMBIEN) 10 MG tablet Take 10 mg by mouth at bedtime as needed for sleep. Take 1/2 tablet or whole tablet at bedtime   No facility-administered encounter medications on file as of 07/29/2021.   Thank you for the opportunity to participate in the care of Eddie. Cueto.  The palliative care team will continue to follow. Please call our office at [phone] if we can be of additional assistance.   This chart was dictated using voice recognition software.  Despite best efforts to proofread,  errors can occur which can change the  documentation meaning.   Questions and concerns were addressed. The patient/family was encouraged to call with questions and/or concerns. My business card was provided. Provided general support and encouragement, no other unmet needs identified   Christin Z Gusler, NP ,   COVID-19 PATIENT SCREENING TOOL Asked and negative response unless otherwise noted:  Have you had symptoms of covid, tested positive or been in contact with someone with symptoms/positive test in the past 5-10 days? NO

## 2021-07-30 ENCOUNTER — Telehealth: Payer: Self-pay | Admitting: Nurse Practitioner

## 2021-07-30 NOTE — Telephone Encounter (Signed)
I called Mrs Buller, updated on [facility] Physicians felt he was eligible for services. Mrs. Hoglund endorses Mr. Swigart was receptive to Hospice at visit yesterday, would like to proceed. Will call Dr Sabra Heck office for order.

## 2021-12-23 DEATH — deceased

## 2022-06-21 IMAGING — CT CT HEAD W/O CM
4 series · 13 of 47 positions shown, 15 images · non-contrast
Comparison: Head CT June 05, 2020.

CLINICAL DATA: Subdural hematoma.

EXAM:
CT HEAD WITHOUT CONTRAST
TECHNIQUE: Contiguous axial images were obtained from the base of the skull
through the vertex without intravenous contrast.

[Series 2: axial st head 5.00 ax · axial · 0.33mm/px · z∈[-526,-427]mm · 5 of 32 slices shown, 7 images]
[im 6/32  brain]
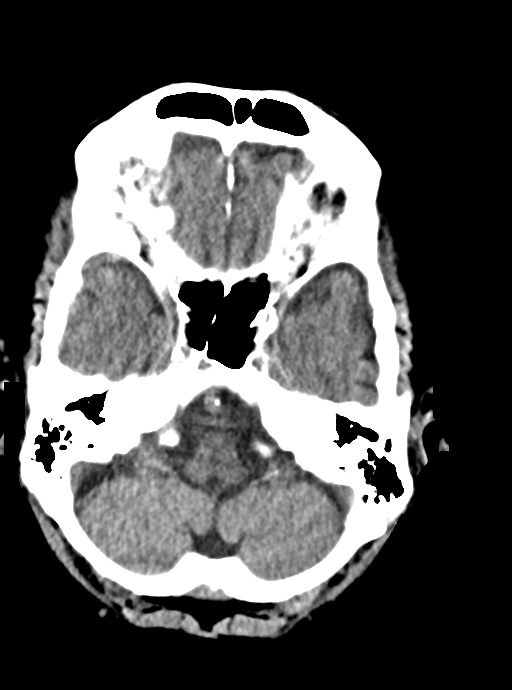
[im 6/32  bone]
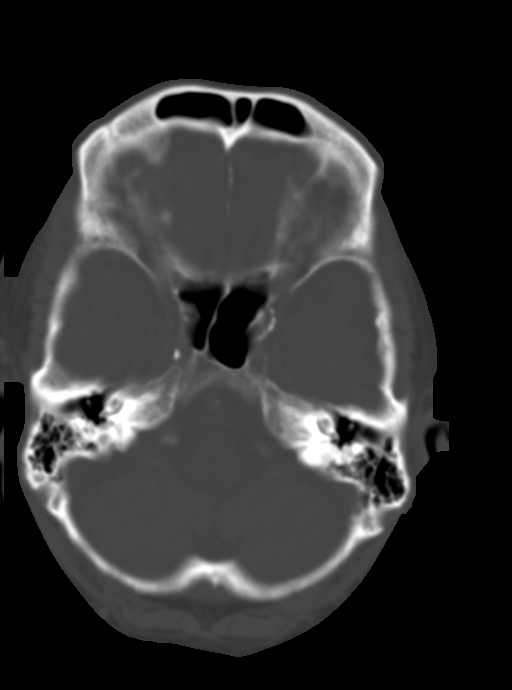
[im 11/32  brain]
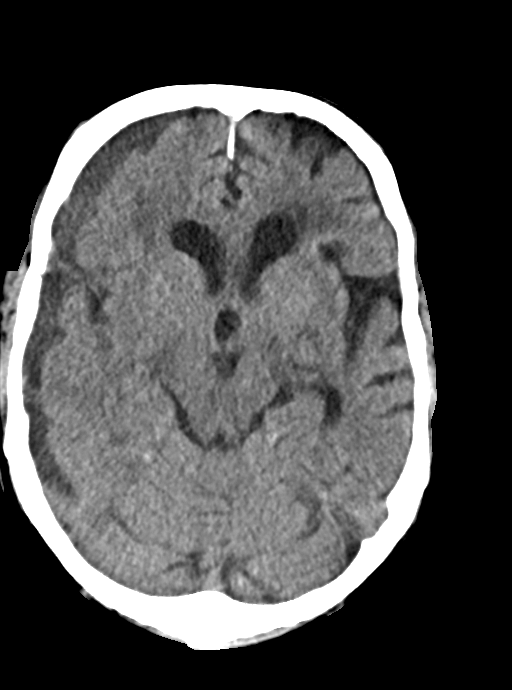
[im 16/32  brain]
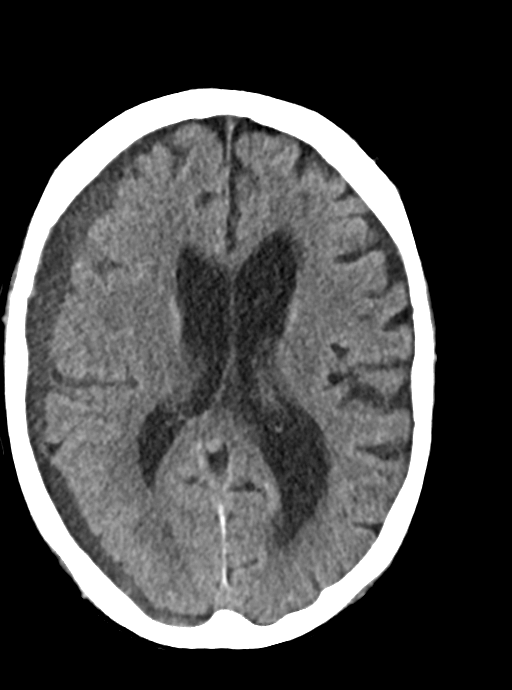
[im 21/32  brain]
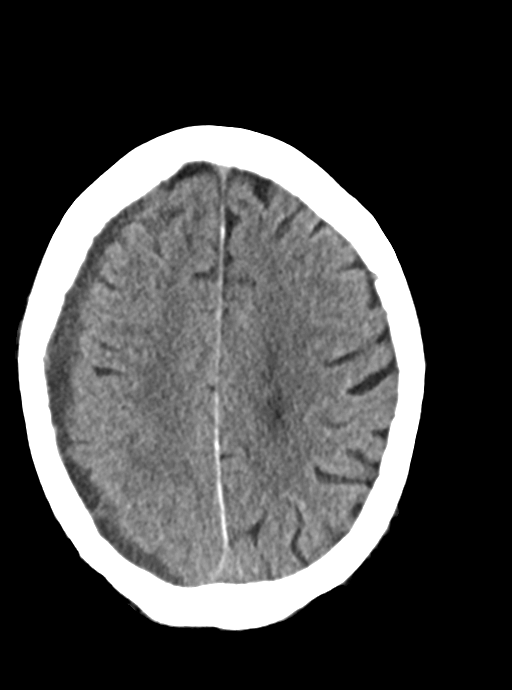
[im 26/32  brain]
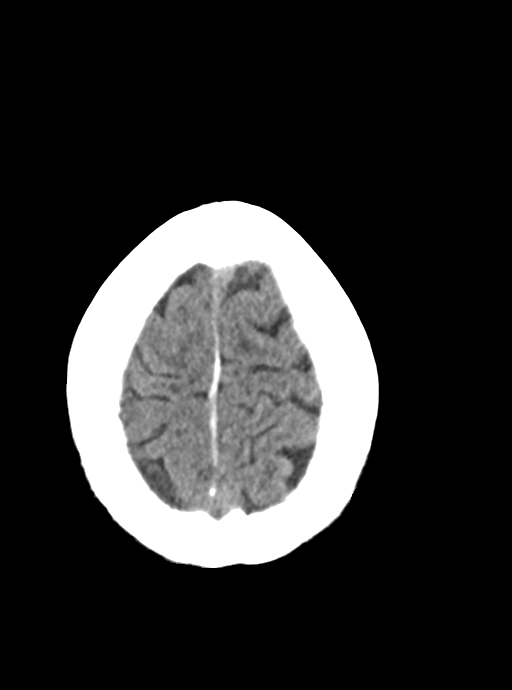
[im 26/32  bone]
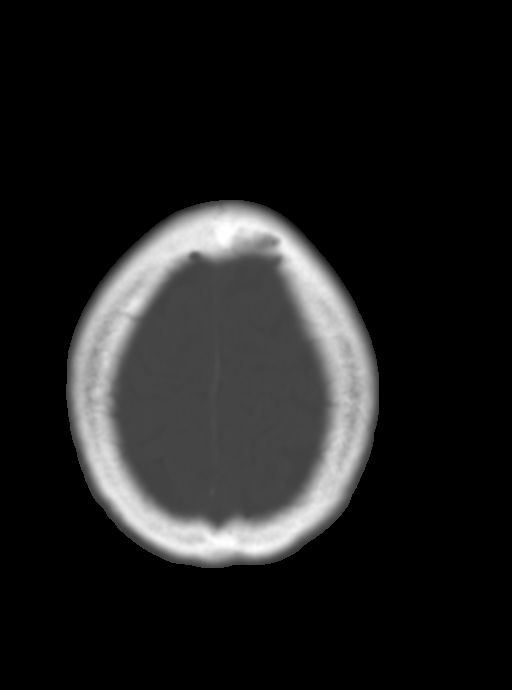

[Series 4: axial bone head 1.50 ax · axial · 0.33mm/px · z∈[-539,-524]mm · 2 of 109 slices shown]
[im 10/109  bone]
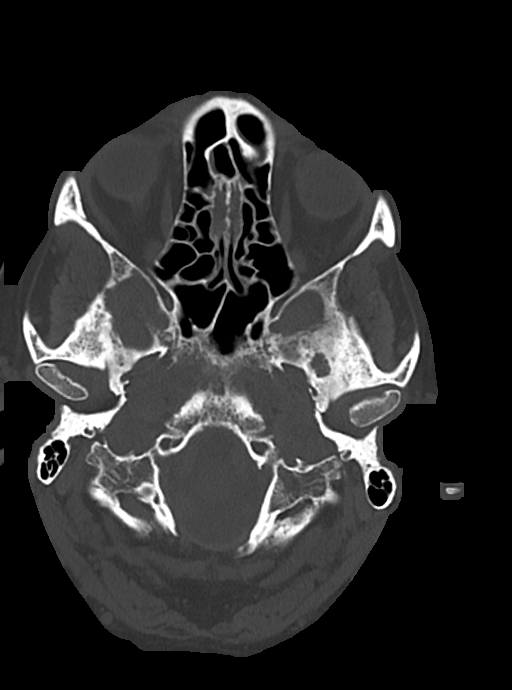
[im 20/109  bone]
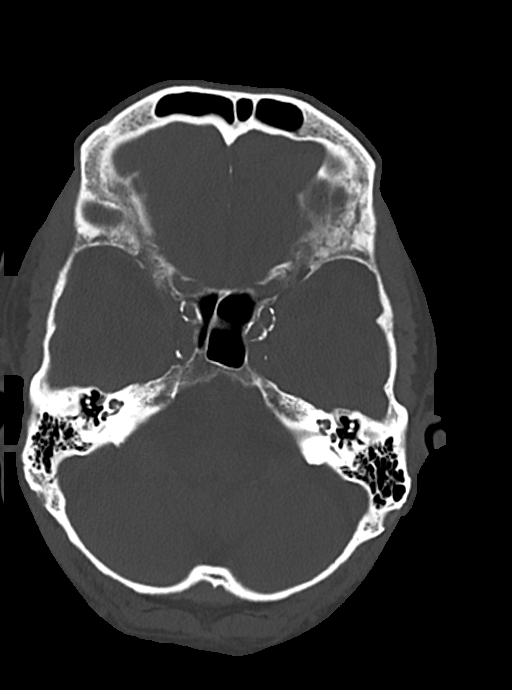

[Series 6: coronals head 3.00 cor · coronal · 0.31mm/px · 3 of 75 slices shown]
[im 25/75  brain]
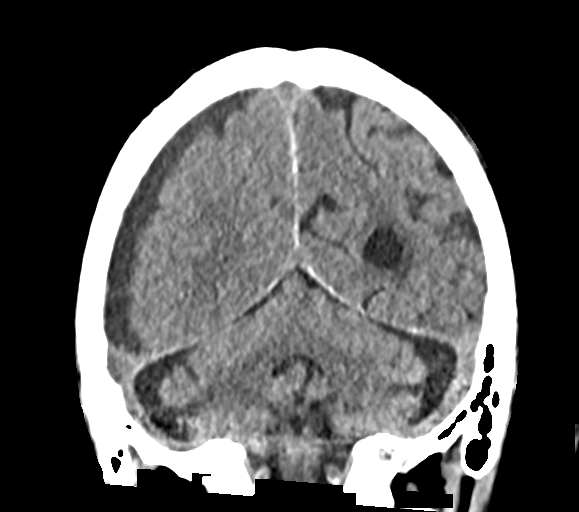
[im 33/75  brain]
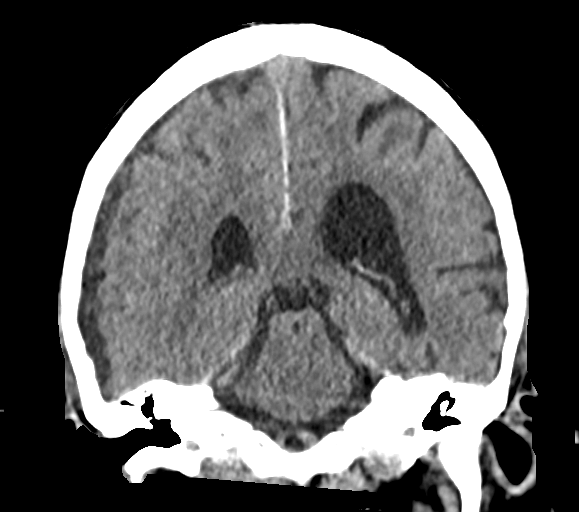
[im 42/75  brain]
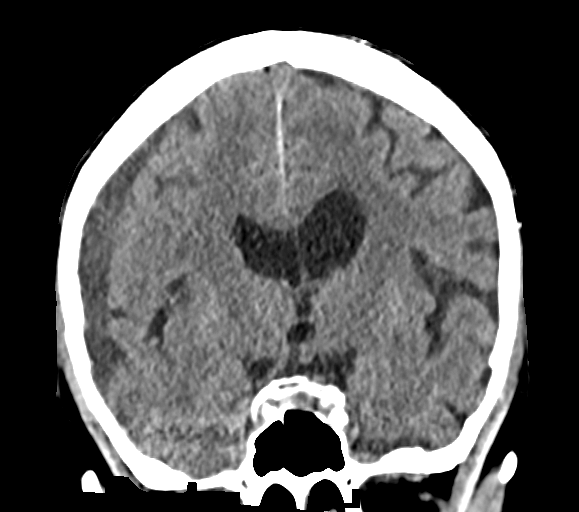

[Series 8: sagittals head 3.00 sag · sagittal · 0.31mm/px · 3 of 59 slices shown]
[im 20/59  brain]
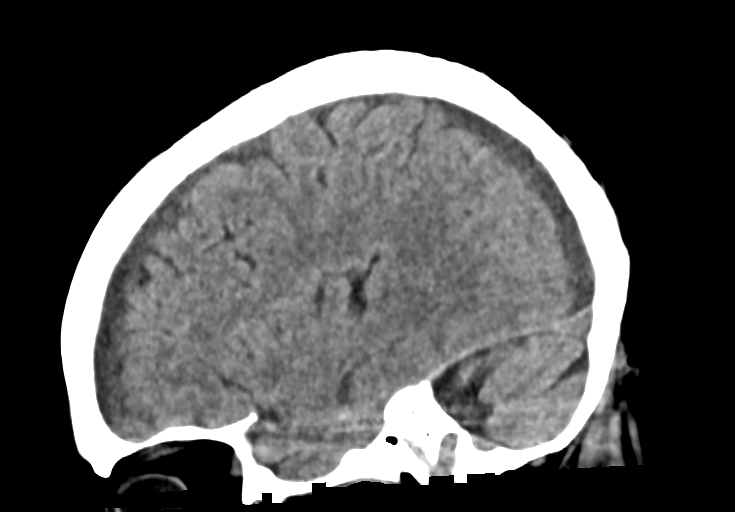
[im 30/59  brain]
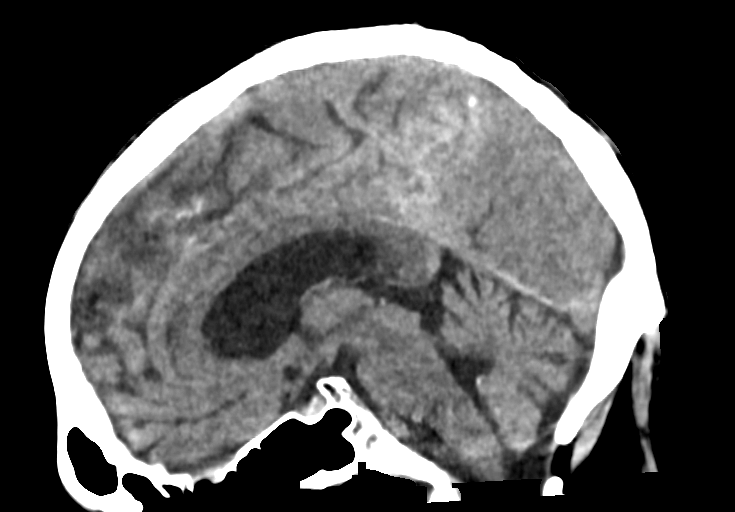
[im 39/59  brain]
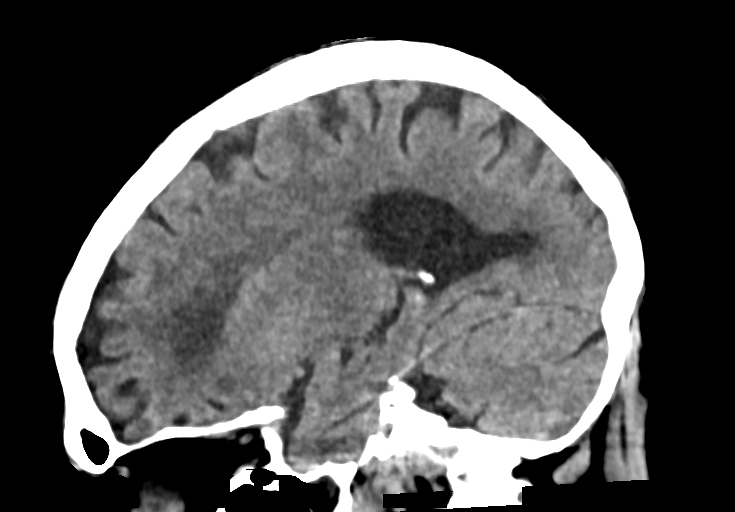

[13 of 47 positions shown; findings below may reference images not displayed]

FINDINGS: Brain: Again seen is a right convexity subdural fluid collection
measuring up to 11 mm (12 mm on prior) now predominantly hypodense.
A small hyperdense focus is seen in the frontal convexity which
could represent minimal rebleed. There is effacement of most of the
cerebral sulci in the right hemisphere as well as mass effect on the
atrium and temporal horn of the right lateral ventricle with a 2-3
mm leftward midline shift. There is minimal interval increase in
size of the frontal horns of the lateral ventricles and third
ventricle. No evidence of an acute infarct or mass lesion.

Vascular: No hyperdense vessel. Calcified plaques in the bilateral
carotid siphons, right vertebral and basilar arteries.

Skull: Normal. Negative for fracture or focal lesion.

Sinuses/Orbits: No acute finding.

Other: None.
IMPRESSION: 1. Stable size of the right convexity subdural fluid collection
measuring up to 11 mm with a small hyperdense focus in the frontal
convexity which could represent minimal rebleed.
2. Slight interval increase in size of the frontal horns of the
lateral ventricles and third ventricle.

## 2022-07-31 IMAGING — US US RENAL
1 series · 14 of 25 positions shown · non-contrast
Comparison: None.

CLINICAL DATA: Acute renal failure

EXAM:
RENAL / URINARY TRACT ULTRASOUND COMPLETE

[Series 1: us renal · 14 of 32 slices shown]
[im 1/32]
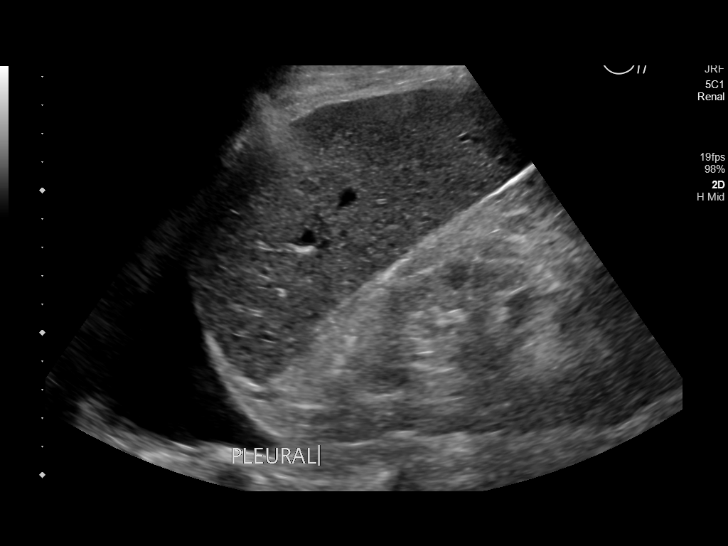
[im 3/32]
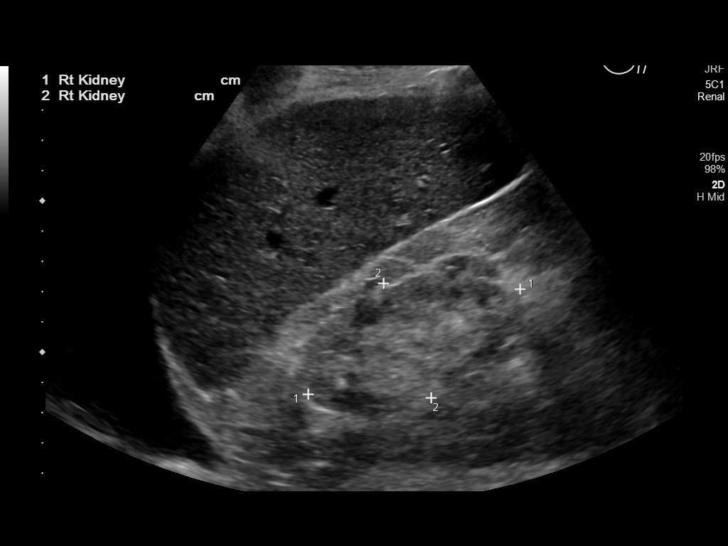
[im 6/32]
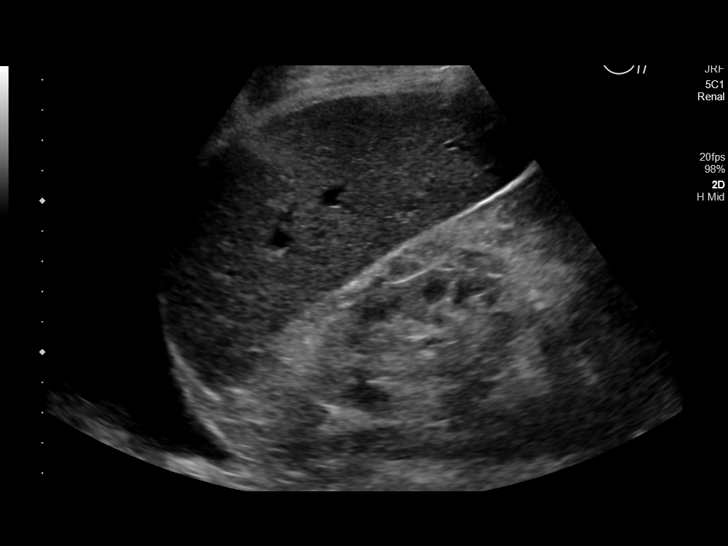
[im 8/32]
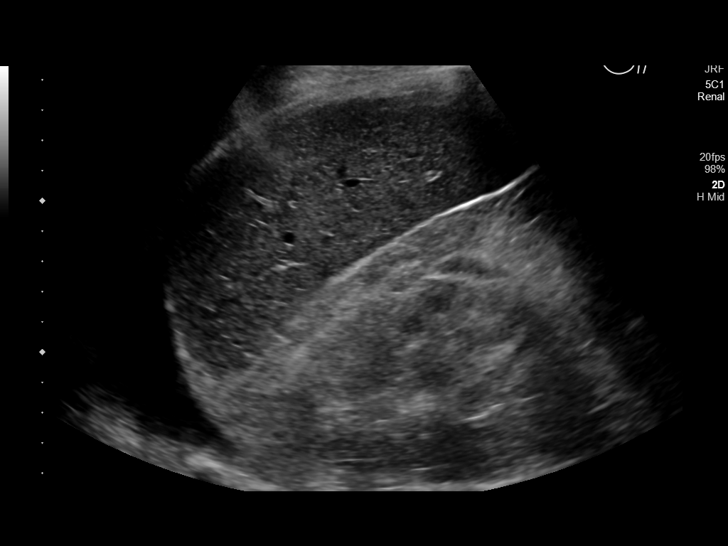
[im 11/32]
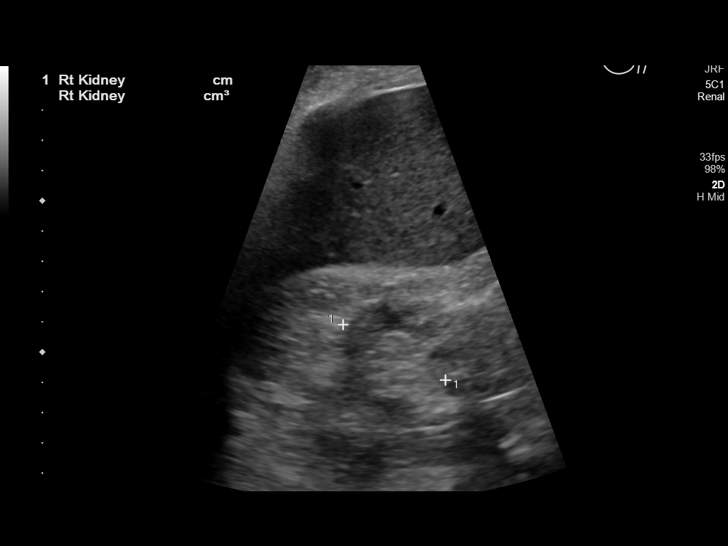
[im 12/32]
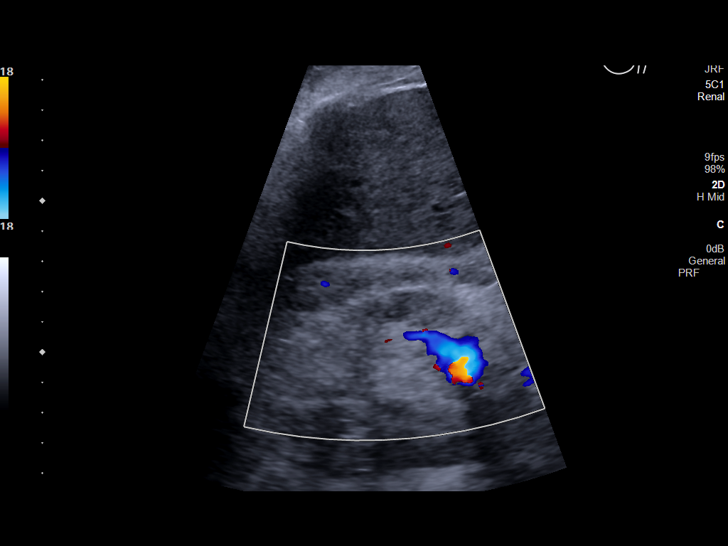
[im 15/32]
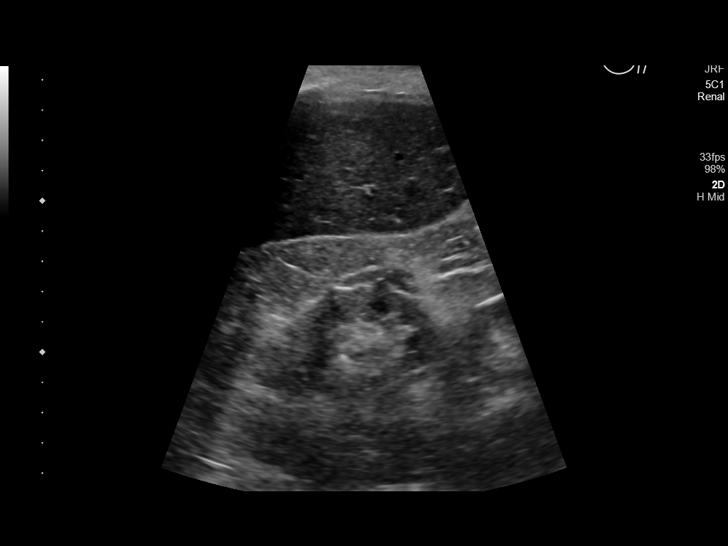
[im 17/32]
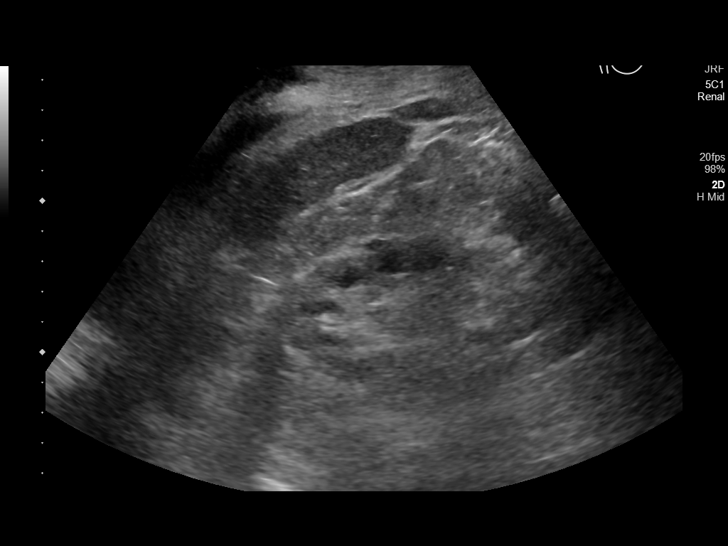
[im 20/32]
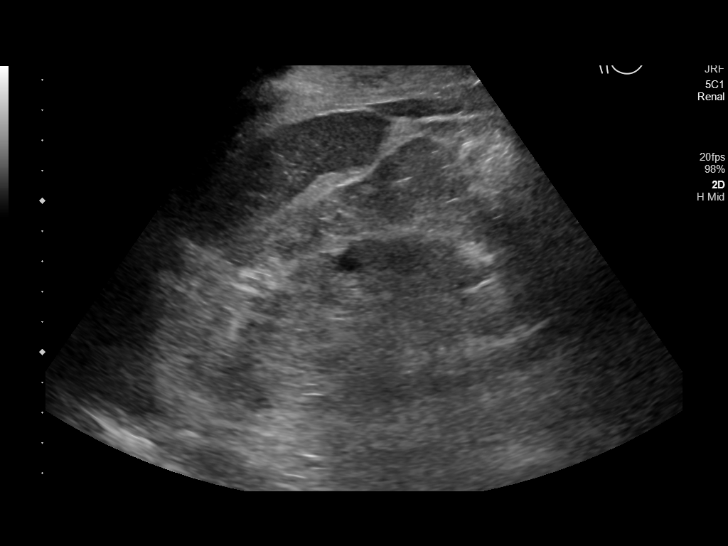
[im 21/32]
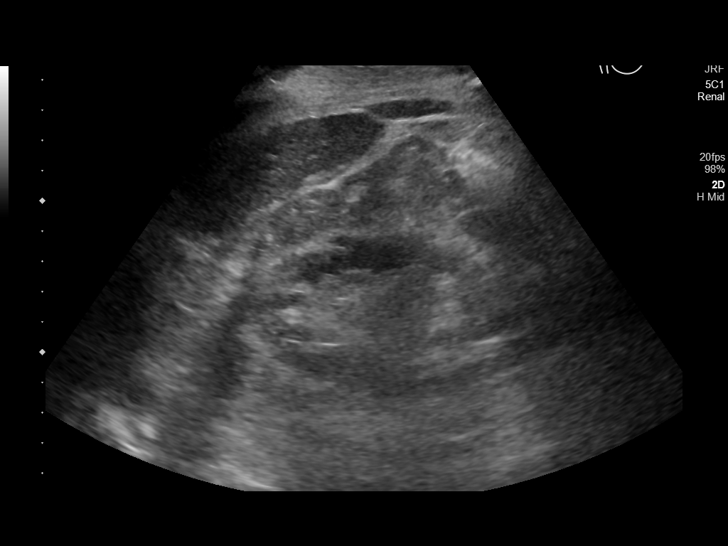
[im 24/32]
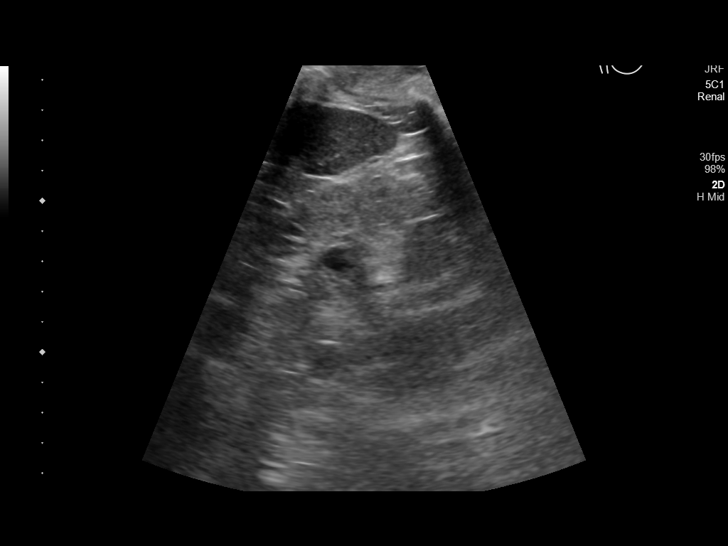
[im 26/32]
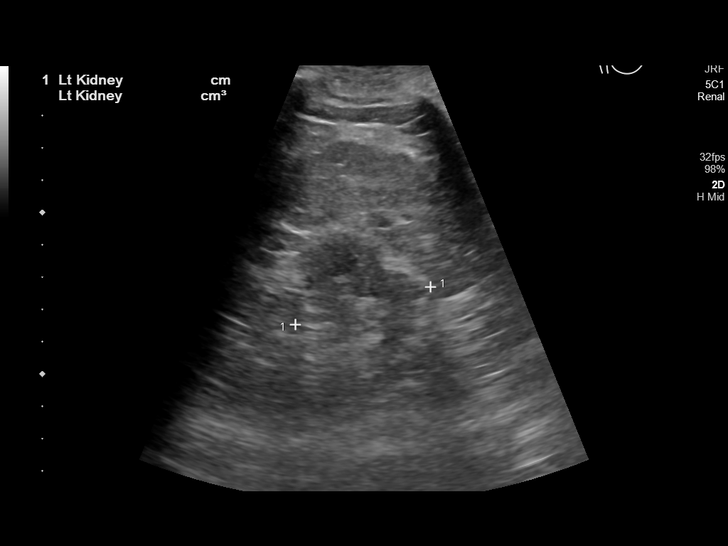
[im 29/32]
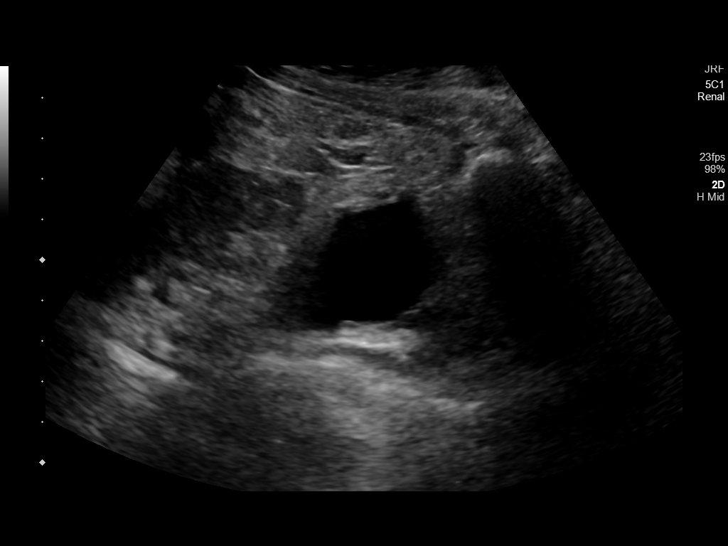
[im 32/32]
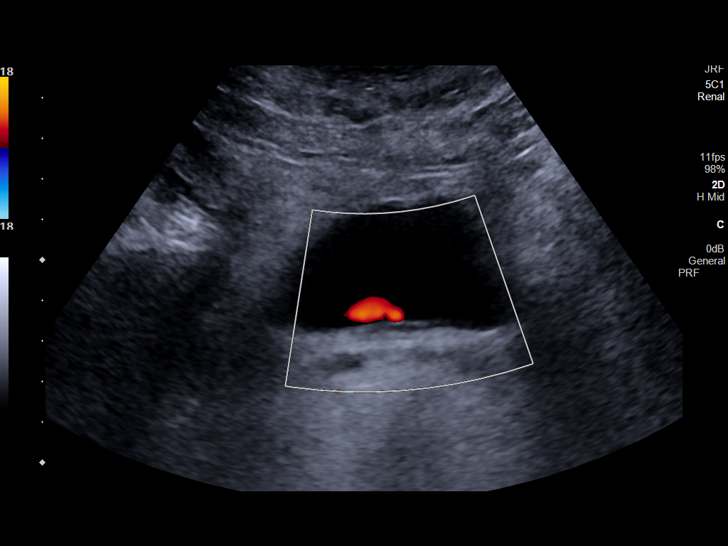

[14 of 25 positions shown; findings below may reference images not displayed]

FINDINGS: Right Kidney:

Renal measurements: 7.8 x 4.1 x 3.9 cm = volume: 65 mL. Cortex is
echogenic and thinned. Cortex is heterogeneous but without discrete
mass. Probable 9 mm nonobstructing stone. No hydronephrosis.

Left Kidney:

Renal measurements: 9.6 x 4.4 x 4.3 cm = volume: 95 mL. Cortex is
echogenic and thinned. Cortex is heterogeneous but without discrete
mass. No hydronephrosis.

Bladder:

Appears normal for degree of bladder distention.

Other:

Bilateral pleural effusions.
IMPRESSION: 1. Both kidneys are small and echogenic with cortical thinning
indicating chronic medical renal disease. No hydronephrosis.
2. Probable nonobstructing 9 mm RIGHT renal stone.
3. Bilateral pleural effusions.
